# Patient Record
Sex: Male | Born: 1975 | Race: White | Hispanic: No | Smoking: Former smoker
Health system: Southern US, Community
[De-identification: ages and names within clinical notes are randomized; demographics above are authoritative.]

## PROBLEM LIST (undated history)

## (undated) DIAGNOSIS — K219 Gastro-esophageal reflux disease without esophagitis: Secondary | ICD-10-CM

## (undated) DIAGNOSIS — Z972 Presence of dental prosthetic device (complete) (partial): Secondary | ICD-10-CM

## (undated) HISTORY — PX: APPENDECTOMY: SHX54

---

## 2004-06-23 ENCOUNTER — Emergency Department: Payer: Self-pay | Admitting: Emergency Medicine

## 2007-01-08 ENCOUNTER — Emergency Department: Payer: Self-pay | Admitting: Emergency Medicine

## 2009-08-22 ENCOUNTER — Emergency Department: Payer: Self-pay | Admitting: Emergency Medicine

## 2010-12-24 ENCOUNTER — Emergency Department: Payer: Self-pay | Admitting: Emergency Medicine

## 2011-06-16 ENCOUNTER — Emergency Department: Payer: Self-pay

## 2012-03-17 ENCOUNTER — Emergency Department: Payer: Self-pay

## 2012-03-17 LAB — RAPID INFLUENZA A&B ANTIGENS

## 2012-12-15 ENCOUNTER — Emergency Department: Payer: Self-pay | Admitting: Emergency Medicine

## 2013-06-24 ENCOUNTER — Emergency Department: Payer: Self-pay | Admitting: Emergency Medicine

## 2017-09-03 ENCOUNTER — Emergency Department: Payer: Commercial Managed Care - PPO

## 2017-09-03 ENCOUNTER — Other Ambulatory Visit: Payer: Self-pay

## 2017-09-03 ENCOUNTER — Emergency Department
Admission: EM | Admit: 2017-09-03 | Discharge: 2017-09-03 | Disposition: A | Discharge status: Home / Self Care | Payer: Commercial Managed Care - PPO | Attending: Emergency Medicine | Admitting: Emergency Medicine

## 2017-09-03 ENCOUNTER — Encounter: Payer: Self-pay | Admitting: Emergency Medicine

## 2017-09-03 DIAGNOSIS — M79609 Pain in unspecified limb: Secondary | ICD-10-CM

## 2017-09-03 DIAGNOSIS — H538 Other visual disturbances: Secondary | ICD-10-CM

## 2017-09-03 DIAGNOSIS — G459 Transient cerebral ischemic attack, unspecified: Secondary | ICD-10-CM | POA: Insufficient documentation

## 2017-09-03 DIAGNOSIS — R202 Paresthesia of skin: Secondary | ICD-10-CM | POA: Insufficient documentation

## 2017-09-03 LAB — DIFFERENTIAL
BASOS PCT: 1 %
Basophils Absolute: 0.1 10*3/uL (ref 0–0.1)
EOS PCT: 4 %
Eosinophils Absolute: 0.3 10*3/uL (ref 0–0.7)
Lymphocytes Relative: 31 %
Lymphs Abs: 2.5 10*3/uL (ref 1.0–3.6)
MONOS PCT: 6 %
Monocytes Absolute: 0.5 10*3/uL (ref 0.2–1.0)
NEUTROS PCT: 58 %
Neutro Abs: 4.7 10*3/uL (ref 1.4–6.5)

## 2017-09-03 LAB — COMPREHENSIVE METABOLIC PANEL
ALT: 45 U/L (ref 17–63)
ANION GAP: 7 (ref 5–15)
AST: 33 U/L (ref 15–41)
Albumin: 4.3 g/dL (ref 3.5–5.0)
Alkaline Phosphatase: 116 U/L (ref 38–126)
BILIRUBIN TOTAL: 0.8 mg/dL (ref 0.3–1.2)
BUN: 10 mg/dL (ref 6–20)
CHLORIDE: 107 mmol/L (ref 101–111)
CO2: 26 mmol/L (ref 22–32)
Calcium: 9.1 mg/dL (ref 8.9–10.3)
Creatinine, Ser: 0.66 mg/dL (ref 0.61–1.24)
GFR calc Af Amer: >60 mL/min (ref >60)
Glucose, Bld: 110 mg/dL — ABNORMAL HIGH (ref 65–99)
POTASSIUM: 3.9 mmol/L (ref 3.5–5.1)
Sodium: 140 mmol/L (ref 135–145)
TOTAL PROTEIN: 7.9 g/dL (ref 6.5–8.1)

## 2017-09-03 LAB — GLUCOSE, CAPILLARY: Glucose-Capillary: 107 mg/dL — ABNORMAL HIGH (ref 65–99)

## 2017-09-03 LAB — TROPONIN I

## 2017-09-03 LAB — APTT: APTT: 35 s (ref 24–36)

## 2017-09-03 LAB — CBC
HEMATOCRIT: 44.2 % (ref 40.0–52.0)
HEMOGLOBIN: 15.1 g/dL (ref 13.0–18.0)
MCH: 33.1 pg (ref 26.0–34.0)
MCHC: 34.1 g/dL (ref 32.0–36.0)
MCV: 96.9 fL (ref 80.0–100.0)
Platelets: 240 10*3/uL (ref 150–440)
RBC: 4.56 MIL/uL (ref 4.40–5.90)
RDW: 13.8 % (ref 11.5–14.5)
WBC: 8.1 10*3/uL (ref 3.8–10.6)

## 2017-09-03 LAB — PROTIME-INR
INR: 0.85
Prothrombin Time: 11.5 seconds (ref 11.4–15.2)

## 2017-09-03 MED ORDER — ASPIRIN EC 325 MG PO TBEC: 325.0000 mg | DELAYED_RELEASE_TABLET | Freq: Every day | ORAL | 0 refills | Status: DC

## 2017-09-03 MED ORDER — IOPAMIDOL (ISOVUE-370) INJECTION 76%
75.0000 mL | Freq: Once | INTRAVENOUS | Status: AC | PRN
  Administered 2017-09-03: 75 mL via INTRAVENOUS

## 2017-09-03 MED ORDER — ASPIRIN 81 MG PO CHEW
324.0000 mg | CHEWABLE_TABLET | Freq: Once | ORAL | Status: AC
  Administered 2017-09-03: 324 mg via ORAL
  Filled 2017-09-03: qty 4

## 2017-09-03 NOTE — Discharge Instructions (Signed)
Your CT scan of the brain and CT angiogram of the head and neck were normal today.  Please follow up with your doctor for further evaluation of stroke risk factors. Continue taking aspirin every day until then.

## 2017-09-03 NOTE — ED Provider Notes (Signed)
[facility] Emergency Department Provider Note  ____________________________________________  Time seen: Approximately 7:57 PM  I have reviewed the triage vital signs and the nursing notes.   HISTORY  Chief Complaint Code Stroke    HPI Manuel Jones is a 42 y.o. male who comes to the ED due to sudden onset of bilateral blurry vision and left upper arm tingling and numbness that started at 1:45 PM today.never had anything like this before. No recent trauma. No blood thinner use. No history of strokes. He complains of mild occipital headache as well. Symptoms are constant, improving, mild to moderate severity at present. No aggravating or alleviating factors.    History reviewed. No pertinent past medical history.   There are no active problems to display for this patient.    Past surgical history noncontributory   Prior to Admission medications   Medication Sig Start Date End Date Taking? Authorizing Provider  aspirin EC 325 MG tablet Take 1 tablet (325 mg total) by mouth daily. 09/03/17   Carrie Mew, MD     Allergies Patient has no known allergies.   History reviewed. No pertinent family history.  Social History Social History   Tobacco Use  . Smoking status: Not on file  Substance Use Topics  . Alcohol use: Not on file  . Drug use: Not on file    Review of Systems  Constitutional:   No fever or chills.  ENT:   No sore throat. No rhinorrhea. Cardiovascular:   No chest pain or syncope. Respiratory:   No dyspnea or cough. Gastrointestinal:   Negative for abdominal pain, vomiting and diarrhea.  Musculoskeletal:   Negative for focal pain or swelling All other systems reviewed and are negative except as documented above in ROS and HPI.  ____________________________________________   PHYSICAL EXAM:  VITAL SIGNS: ED Triage Vitals  Enc Vitals Group     BP 09/03/17 1542 131/90     Pulse Rate 09/03/17 1542 83     Resp 09/03/17  1556 17     Temp 09/03/17 1542 98.2 F (36.8 C)     Temp Source 09/03/17 1542 Oral     SpO2 09/03/17 1542 98 %     Weight 09/03/17 1644 225 lb (102.1 kg)     Height 09/03/17 1644 6' (1.829 m)     Head Circumference --      Peak Flow --      Pain Score 09/03/17 1542 0     Pain Loc --      Pain Edu? --      Excl. in [place]? --     Vital signs reviewed, nursing assessments reviewed.   Constitutional:   Alert and oriented. Well appearing and in no distress. Eyes:   Conjunctivae are normal. EOMI. PERRL.no nystagmus. No skew ENT      Head:   Normocephalic and atraumatic.      Nose:   No congestion/rhinnorhea.       Mouth/Throat:   MMM, no pharyngeal erythema. No peritonsillar mass.       Neck:   No meningismus. Full ROM. Hematological/Lymphatic/Immunilogical:   No cervical lymphadenopathy. Cardiovascular:   RRR. Symmetric bilateral radial and DP pulses.  No murmurs.  Respiratory:   Normal respiratory effort without tachypnea/retractions. Breath sounds are clear and equal bilaterally. No wheezes/rales/rhonchi. Gastrointestinal:   Soft and nontender. Non distended. There is no CVA tenderness.  No rebound, rigidity, or guarding.  Musculoskeletal:   Normal range of motion in all extremities. No joint  effusions.  No lower extremity tenderness.  No edema. Neurologic:   Normal speech and language.  Motor grossly intact. cerebellar function normal NIH stroke scale 1 for left upper extremity paresthesia No acute focal neurologic deficits are appreciated.  Skin:    Skin is warm, dry and intact. No rash noted.  No petechiae, purpura, or bullae.  ____________________________________________    LABS (pertinent positives/negatives) (all labs ordered are listed, but only abnormal results are displayed) Labs Reviewed  GLUCOSE, CAPILLARY - Abnormal; Notable for the following components:      Result Value   Glucose-Capillary 107 (*)    All other components within normal limits  COMPREHENSIVE  METABOLIC PANEL - Abnormal; Notable for the following components:   Glucose, Bld 110 (*)    All other components within normal limits  PROTIME-INR  APTT  CBC  DIFFERENTIAL  TROPONIN I  CBG MONITORING, ED   ____________________________________________   EKG  interpreted by me Sinus rhythm rate of 79, normal axis and intervals. Normal QRS ST segments and T waves.  ____________________________________________    RADIOLOGY  Ct Angio Head W Or Wo Contrast  Result Date: 09/03/2017 CLINICAL DATA:  Initial evaluation for acute left eye blurriness, presyncopal symptoms. EXAM: CT ANGIOGRAPHY HEAD AND NECK TECHNIQUE: Multidetector CT imaging of the head and neck was performed using the standard protocol during bolus administration of intravenous contrast. Multiplanar CT image reconstructions and MIPs were obtained to evaluate the vascular anatomy. Carotid stenosis measurements (when applicable) are obtained utilizing NASCET criteria, using the distal internal carotid diameter as the denominator. CONTRAST:  69mL ISOVUE-370 IOPAMIDOL (ISOVUE-370) INJECTION 76% COMPARISON:  Prior noncontrast head CT from earlier the same day. FINDINGS: CTA NECK FINDINGS Aortic arch: Visualized aortic arch of normal caliber with normal 3 vessel morphology. No flow-limiting stenosis about the origin of the great vessels. Visualized subclavian arteries widely patent. Right carotid system: Right common and internal carotid arteries widely patent without stenosis, dissection, or occlusion. No significant atheromatous narrowing about the right carotid bifurcation. Left carotid system: Left common and internal carotid arteries widely patent without stenosis, dissection, or occlusion. No significant atheromatous narrowing about the left carotid bifurcation. Vertebral arteries: Both of the vertebral arteries arise from the subclavian arteries. Vertebral arteries widely patent within the neck without stenosis, dissection or  occlusion. Skeleton: No acute osseus abnormality. No worrisome lytic or blastic osseous lesions. Other neck: No acute soft tissue abnormality within the neck. No adenopathy. Salivary glands demonstrate no acute finding. Thyroid normal. Upper chest: Visualized upper chest and mediastinum within normal limits. Partially visualized lungs are grossly clear. Centrilobular and paraseptal emphysema noted. Review of the MIP images confirms the above findings CTA HEAD FINDINGS Anterior circulation: Internal carotid arteries widely patent to the termini without stenosis. ICA termini themselves are widely patent. A1 segments, anterior communicating artery common anterior cerebral arteries widely patent bilaterally. No M1 stenosis or occlusion. Normal MCA bifurcations. No proximal M2 occlusion. Distal MCA branches well opacified and symmetric. Posterior circulation: Vertebral arteries widely patent to the vertebrobasilar junction without stenosis. Posterior inferior cerebral arteries patent bilaterally. Basilar artery widely patent to its distal aspect without stenosis. Superior cerebellar and posterior cerebral arteries widely patent bilaterally. Venous sinuses: Patent. Anatomic variants: None significant.  No aneurysm. Delayed phase: No abnormal enhancement. Review of the MIP images confirms the above findings IMPRESSION: 1. Normal CTA of the head and neck. No large vessel occlusion. No hemodynamically significant or correctable stenosis. 2. Emphysema. Electronically Signed   By: Pincus Badder.D.  On: 09/03/2017 18:00   Ct Angio Neck W And/or Wo Contrast  Result Date: 09/03/2017 CLINICAL DATA:  Initial evaluation for acute left eye blurriness, presyncopal symptoms. EXAM: CT ANGIOGRAPHY HEAD AND NECK TECHNIQUE: Multidetector CT imaging of the head and neck was performed using the standard protocol during bolus administration of intravenous contrast. Multiplanar CT image reconstructions and MIPs were obtained to  evaluate the vascular anatomy. Carotid stenosis measurements (when applicable) are obtained utilizing NASCET criteria, using the distal internal carotid diameter as the denominator. CONTRAST:  54mL ISOVUE-370 IOPAMIDOL (ISOVUE-370) INJECTION 76% COMPARISON:  Prior noncontrast head CT from earlier the same day. FINDINGS: CTA NECK FINDINGS Aortic arch: Visualized aortic arch of normal caliber with normal 3 vessel morphology. No flow-limiting stenosis about the origin of the great vessels. Visualized subclavian arteries widely patent. Right carotid system: Right common and internal carotid arteries widely patent without stenosis, dissection, or occlusion. No significant atheromatous narrowing about the right carotid bifurcation. Left carotid system: Left common and internal carotid arteries widely patent without stenosis, dissection, or occlusion. No significant atheromatous narrowing about the left carotid bifurcation. Vertebral arteries: Both of the vertebral arteries arise from the subclavian arteries. Vertebral arteries widely patent within the neck without stenosis, dissection or occlusion. Skeleton: No acute osseus abnormality. No worrisome lytic or blastic osseous lesions. Other neck: No acute soft tissue abnormality within the neck. No adenopathy. Salivary glands demonstrate no acute finding. Thyroid normal. Upper chest: Visualized upper chest and mediastinum within normal limits. Partially visualized lungs are grossly clear. Centrilobular and paraseptal emphysema noted. Review of the MIP images confirms the above findings CTA HEAD FINDINGS Anterior circulation: Internal carotid arteries widely patent to the termini without stenosis. ICA termini themselves are widely patent. A1 segments, anterior communicating artery common anterior cerebral arteries widely patent bilaterally. No M1 stenosis or occlusion. Normal MCA bifurcations. No proximal M2 occlusion. Distal MCA branches well opacified and symmetric.  Posterior circulation: Vertebral arteries widely patent to the vertebrobasilar junction without stenosis. Posterior inferior cerebral arteries patent bilaterally. Basilar artery widely patent to its distal aspect without stenosis. Superior cerebellar and posterior cerebral arteries widely patent bilaterally. Venous sinuses: Patent. Anatomic variants: None significant.  No aneurysm. Delayed phase: No abnormal enhancement. Review of the MIP images confirms the above findings IMPRESSION: 1. Normal CTA of the head and neck. No large vessel occlusion. No hemodynamically significant or correctable stenosis. 2. Emphysema. Electronically Signed   By: Jeannine Boga M.D.   On: 09/03/2017 18:00   Ct Head Code Stroke Wo Contrast  Result Date: 09/03/2017 CLINICAL DATA:  Code stroke. Left-sided blurred vision and left-sided weakness beginning 1 hour ago. EXAM: CT HEAD WITHOUT CONTRAST TECHNIQUE: Contiguous axial images were obtained from the base of the skull through the vertex without intravenous contrast. COMPARISON:  None FINDINGS: Brain: No acute infarct, hemorrhage, or mass lesion is present. The ventricles are of normal size. White matter is within normal limits. Basal ganglia are intact. Insular cortex is normal. The brainstem and cerebellum are within normal limits. Vascular: Calcification. Skull: Calvarium is intact. No focal lytic or blastic lesions are present. No significant extracranial soft tissue lesions are present. Sinuses/Orbits: The paranasal sinuses and the mastoid air cells are clear. Globes and orbits are within normal limits. ASPECTS [facility] Stroke Program Early CT Score) - Ganglionic level infarction (caudate, lentiform nuclei, internal capsule, insula, M1-M3 cortex): 7/7 - Supraganglionic infarction (M4-M6 cortex): 3/3 Total score (0-10 with 10 being normal): 10/10 IMPRESSION: 1. Negative CT of the head. 2. ASPECTS is  10/10 These results were called by telephone at the time of interpretation  on 09/03/2017 at 3:56 pm to Dr. Carrie Mew , who verbally acknowledged these results. Electronically Signed   By: San Morelle M.D.   On: 09/03/2017 16:02    ____________________________________________   PROCEDURES .Critical Care Performed by: Carrie Mew, MD Authorized by: Carrie Mew, MD   Critical care provider statement:    Critical care time (minutes):  30   Critical care time was exclusive of:  Separately billable procedures and treating other patients   Critical care was necessary to treat or prevent imminent or life-threatening deterioration of the following conditions:  CNS failure or compromise   Critical care was time spent personally by me on the following activities:  Development of treatment plan with patient or surrogate, discussions with consultants, evaluation of patient's response to treatment, examination of patient, obtaining history from patient or surrogate, ordering and performing treatments and interventions, ordering and review of laboratory studies, ordering and review of radiographic studies, pulse oximetry, re-evaluation of patient's condition and review of old charts    ____________________________________________  DIFFERENTIAL DIAGNOSIS   acute ischemic stroke, intracranial hemorrhage, TIA, atypical headache syndrome. Low suspicion for temporal arteritis, intracranial hypertension, meningitis.  CLINICAL IMPRESSION / ASSESSMENT AND PLAN / ED COURSE  Pertinent labs & imaging results that were available during my care of the patient were reviewed by me and considered in my medical decision making (see chart for details).      Clinical Course as of Sep 04 1955  Thu Sep 03, 2017  1558 LKW 1:45 pm today, subjective neuro complaints of left eye blurry, LUE tingling. Will obtain neuro consult.    [PS]  1600 CT head negative per radiology call.    [PS]  2694 D/w neurology after their eval. Offered TPA, pt declines. Recommends CTA  head/neck. Aspirin. Observation for stroke workup   [PS]  1809 CTA head/neck normal. Will d/w hospitalist for further eval.    [PS]  1953 CT angiogram results discussed with patient. Discussed neurology recommendation for hospitalization for further stroke workup. Patient declines and would rather follow up with his primary care doctor for outpatient workup. He is agreeable to continuing daily aspirin in the meantime. Return precautions discussed, he'll come back to the hospital if he has any worsening stroke symptoms or new concerns.he does report that his symptoms are resolved at this time. He feels back to normal.   [PS]    Clinical Course User Index [PS] Carrie Mew, MD    ----------------------------------------- 8:01 PM on 09/03/2017 -----------------------------------------  Patient initially required complex and involved care in the emergency department pending initial workup due to concerns for acute stroke within the TPA or neuro interventional window that would require time sensitive interventions.  forcibly workup has been unremarkable so far. Patient was offered hospitalization but declines for his own preference. He does have medical decision-making capacity and will be discharged home.   ____________________________________________   FINAL CLINICAL IMPRESSION(S) / ED DIAGNOSES    Final diagnoses:  TIA (transient ischemic attack)  Blurry vision, bilateral  Paresthesia and pain of left extremity     ED Discharge Orders        Ordered    aspirin EC 325 MG tablet  Daily     09/03/17 1956      Portions of this note were generated with dragon dictation software. Dictation errors may occur despite best attempts at proofreading.    Carrie Mew, MD 09/03/17 2002

## 2017-09-03 NOTE — ED Triage Notes (Signed)
CBG 107 

## 2017-09-03 NOTE — Progress Notes (Signed)
Chaplain was paged for a code stroke. Pt was alert and speaking. No family was present. He said his mother will come after work. Chaplain prayed silently for Pt and care team. Chaplain asked if anytime the Pt needed, he said no. Chaplain let him know if he needed services to let the nurse know.    09/03/17 1600  Clinical Encounter Type  Visited With Patient  Visit Type Initial;Code  Referral From Nurse  Spiritual Encounters  Spiritual Needs Prayer

## 2017-09-03 NOTE — ED Notes (Signed)
CODE STROKE CALLED TO 333 

## 2017-09-03 NOTE — Consult Note (Signed)
   [facility] TeleNeurology Consult Services  Impression:  Stroke   Not a tpa candidate due PO:EUMPNTIR resolved. no residual disabling symptoms. TPA was offered but patient did not want to proceed Symptoms  not consistent with LVO therefore no NIR  Comments:   Last Known Well: 13:45 [facility] contacted: 15:57 [facility] at bedside: 15:59 NIHSS assessment time: 16:02  Recommendations:  Admit for stroke workup. ASA/Statin if no contraindications. IV Fluids Inpatient neurology consultation Inpatient stroke evaluation as per Neurology/ Internal Medicine Discussed with ED MD Please call with questions  -----------------------------------------------------------------------------------------  CC: blurry vision and left sided numbness  History of Present Illness:  Patient is a 42 YO M with no significant PMH presented with left eye blurry vision, feeling of nearly passing out and left sided numbness that started at 13:45. States that symptoms are improved. Denies any speech or language deficits. Denies any Loss of vision. No facial droop   Diagnostic: CT Head: No acute Intracranial findings.  Exam: NIH Stroke Scale/Score (NIHSS)   RESULT SUMMARY: 1 points NIH Stroke Scale   INPUTS: 1A: Level of consciousness -> 0 = Alert; keenly responsive 1B: Ask month and age -> 0 = Both questions right 1C: 'Blink eyes' & 'squeeze hands' -> 0 = Performs both tasks 2: Horizontal extraocular movements -> 0 = Normal 3: Visual fields -> 0 = No visual loss 4: Facial palsy -> 0 = Normal symmetry 5A: Left arm motor drift -> 0 = No drift for 10 seconds 5B: Right arm motor drift -> 0 = No drift for 10 seconds 6A: Left leg motor drift -> 0 = No drift for 5 seconds 6B: Right leg motor drift -> 0 = No drift for 5 seconds 7: Limb Ataxia -> 0 = No ataxia 8: Sensation -> 1 = Mild-moderate loss: less sharp/more dull  9: Language/aphasia -> 0 = Normal; no aphasia 10: Dysarthria -> 0 =  Normal 11: Extinction/inattention -> 0 = No abnormality  Medical Decision Making:  - Extensive number of diagnosis or management options are considered above.   - Extensive amount of complex data reviewed.   - High risk of complication and/or morbidity or mortality are associated with differential diagnostic considerations above.  - There may be Uncertain outcome and increased probability of prolonged functional impairment or high probability of severe prolonged functional impairment associated with some of these differential diagnosis.  Medical Data Reviewed:  1.Data reviewed include clinical labs, radiology,  Medical Tests;   2.Tests results discussed w/performing or interpreting physician;   3.Obtaining/reviewing old medical records;  4.Obtaining case history from another source;  5.Independent review of image, tracing or specimen.    Patient was informed the neurology consult would happen via telehealth consult by way of interactive audio and video telecommunications and consented to receiving care in this manner.

## 2017-09-03 NOTE — ED Triage Notes (Signed)
Pt reports that an hour ago he developed left eye blurriness and feeling like he was going to pass out. Pt states that he feels that he is weaker on the left side.

## 2017-09-03 NOTE — Code Documentation (Signed)
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

## 2018-03-30 ENCOUNTER — Other Ambulatory Visit: Payer: Self-pay

## 2018-03-30 ENCOUNTER — Emergency Department: Payer: Commercial Managed Care - PPO

## 2018-03-30 ENCOUNTER — Encounter: Payer: Self-pay | Admitting: Emergency Medicine

## 2018-03-30 ENCOUNTER — Emergency Department
Admission: EM | Admit: 2018-03-30 | Discharge: 2018-03-30 | Disposition: A | Discharge status: Home / Self Care | Payer: Commercial Managed Care - PPO | Attending: Emergency Medicine | Admitting: Emergency Medicine

## 2018-03-30 DIAGNOSIS — Z7982 Long term (current) use of aspirin: Secondary | ICD-10-CM | POA: Insufficient documentation

## 2018-03-30 DIAGNOSIS — F17210 Nicotine dependence, cigarettes, uncomplicated: Secondary | ICD-10-CM | POA: Insufficient documentation

## 2018-03-30 DIAGNOSIS — R0789 Other chest pain: Secondary | ICD-10-CM | POA: Diagnosis not present

## 2018-03-30 DIAGNOSIS — M79622 Pain in left upper arm: Secondary | ICD-10-CM | POA: Diagnosis not present

## 2018-03-30 DIAGNOSIS — R079 Chest pain, unspecified: Secondary | ICD-10-CM | POA: Diagnosis present

## 2018-03-30 DIAGNOSIS — K2100 Gastro-esophageal reflux disease with esophagitis, without bleeding: Secondary | ICD-10-CM

## 2018-03-30 LAB — BASIC METABOLIC PANEL
Anion gap: 6 (ref 5–15)
BUN: 9 mg/dL (ref 6–20)
CO2: 26 mmol/L (ref 22–32)
Calcium: 8.8 mg/dL — ABNORMAL LOW (ref 8.9–10.3)
Chloride: 112 mmol/L — ABNORMAL HIGH (ref 98–111)
Creatinine, Ser: 0.82 mg/dL (ref 0.61–1.24)
GFR calc Af Amer: >60 mL/min (ref >60)
GFR calc non Af Amer: >60 mL/min (ref >60)
GLUCOSE: 145 mg/dL — AB (ref 70–99)
Potassium: 3.2 mmol/L — ABNORMAL LOW (ref 3.5–5.1)
Sodium: 144 mmol/L (ref 135–145)

## 2018-03-30 LAB — CBC
HEMATOCRIT: 41.9 % (ref 39.0–52.0)
Hemoglobin: 14.2 g/dL (ref 13.0–17.0)
MCH: 32.5 pg (ref 26.0–34.0)
MCHC: 33.9 g/dL (ref 30.0–36.0)
MCV: 95.9 fL (ref 80.0–100.0)
Platelets: 243 10*3/uL (ref 150–400)
RBC: 4.37 MIL/uL (ref 4.22–5.81)
RDW: 12.6 % (ref 11.5–15.5)
WBC: 8.7 10*3/uL (ref 4.0–10.5)
nRBC: 0 % (ref 0.0–0.2)

## 2018-03-30 LAB — TROPONIN I: Troponin I: <0.03 ng/mL (ref <0.03)

## 2018-03-30 LAB — FIBRIN DERIVATIVES D-DIMER (ARMC ONLY): Fibrin derivatives D-dimer (ARMC): 484.77 ng/mL (FEU) (ref 0.00–499.00)

## 2018-03-30 MED ORDER — FAMOTIDINE 40 MG PO TABS: 40.0000 mg | ORAL_TABLET | Freq: Every evening | ORAL | 1 refills | Status: DC

## 2018-03-30 MED ORDER — ALUM & MAG HYDROXIDE-SIMETH 200-200-20 MG/5ML PO SUSP
30.0000 mL | Freq: Once | ORAL | Status: AC
  Administered 2018-03-30: 30 mL via ORAL
  Filled 2018-03-30: qty 30

## 2018-03-30 MED ORDER — SUCRALFATE 1 G PO TABS: 1.0000 g | ORAL_TABLET | Freq: Four times a day (QID) | ORAL | 0 refills | Status: DC

## 2018-03-30 MED ORDER — LIDOCAINE VISCOUS HCL 2 % MT SOLN
15.0000 mL | Freq: Once | OROMUCOSAL | Status: AC
  Administered 2018-03-30: 15 mL via ORAL
  Filled 2018-03-30: qty 15

## 2018-03-30 NOTE — ED Triage Notes (Signed)
Pt presents to ED with c/o sharp/burning chest pain to the L side x 3 days with radiation to neck and L arm. Pt states pain is intermittent at this time.

## 2018-03-30 NOTE — ED Triage Notes (Signed)
Pt care discussed with Dr. Joni Fears, per Dr. Joni Fears, add on a D-dimer.

## 2018-03-30 NOTE — ED Provider Notes (Signed)
[facility] Emergency Department Provider Note   ____________________________________________   I have reviewed the triage vital signs and the nursing notes.   HISTORY  Chief Complaint Chest Pain   History limited by: Not Limited   HPI Manuel Jones is a 42 y.o. male who presents to the emergency department today because of concern for chest pain. The patient states that the pain started three days ago. The pain is located in the center chest and left chest.  There is some radiation to the left upper arm.  The patient states the pain did seem to get somewhat better when he however would then get worse again.  He did not notice any worse needing of the pain with exertion.  He denies any shortness of breath or nausea.  He denies any fevers.   Per medical record review patient has a history of appendectomy  History reviewed. No pertinent past medical history.  There are no active problems to display for this patient.   Past Surgical History:  Procedure Laterality Date  . APPENDECTOMY      Prior to Admission medications   Medication Sig Start Date End Date Taking? Authorizing Provider  aspirin EC 325 MG tablet Take 1 tablet (325 mg total) by mouth daily. 09/03/17   Carrie Mew, MD    Allergies Patient has no known allergies.  No family history on file.  Social History Social History   Tobacco Use  . Smoking status: Current Every Day Smoker    Packs/day: 0.50    Types: Cigarettes  . Smokeless tobacco: Never Used  Substance Use Topics  . Alcohol use: Not Currently  . Drug use: Not Currently    Review of Systems Constitutional: No fever/chills Eyes: No visual changes. ENT: No sore throat. Cardiovascular: Positive for chest pain Respiratory: Denies shortness of breath. Gastrointestinal: No abdominal pain.  No nausea, no vomiting.  No diarrhea.  Genitourinary: Negative for dysuria. Musculoskeletal: Negative for back pain. Skin:  Negative for rash. Neurological: Negative for headaches, focal weakness or numbness.  ____________________________________________   PHYSICAL EXAM:  VITAL SIGNS: ED Triage Vitals [03/30/18 1839]  Enc Vitals Group     BP 131/82     Pulse Rate (!) 101     Resp 18     Temp 98.1 F (36.7 C)     Temp Source Oral     SpO2 98 %     Weight 230 lb (104.3 kg)     Height 6' (1.829 m)     Head Circumference      Peak Flow      Pain Score 4   Constitutional: Alert and oriented.  Eyes: Conjunctivae are normal.  ENT      Head: Normocephalic and atraumatic.      Nose: No congestion/rhinnorhea.      Mouth/Throat: Mucous membranes are moist.      Neck: No stridor. Hematological/Lymphatic/Immunilogical: No cervical lymphadenopathy. Cardiovascular: Normal rate, regular rhythm.  No murmurs, rubs, or gallops.  Respiratory: Normal respiratory effort without tachypnea nor retractions. Breath sounds are clear and equal bilaterally. No wheezes/rales/rhonchi. Gastrointestinal: Soft and non tender. No rebound. No guarding.  Genitourinary: Deferred Musculoskeletal: Normal range of motion in all extremities. No lower extremity edema. Neurologic:  Normal speech and language. No gross focal neurologic deficits are appreciated.  Skin:  Skin is warm, dry and intact. No rash noted. Psychiatric: Mood and affect are normal. Speech and behavior are normal. Patient exhibits appropriate insight and judgment.  ____________________________________________  LABS (pertinent positives/negatives) Trop <0.03 D-dimer 484 CBC wbc 8.7, hgb 14.2, plt 243 BMP na 144, k 3.2, glu 145, cr 0.82  ____________________________________________   EKG  I, Nance Pear, attending physician, personally viewed and interpreted this EKG  EKG Time: 1830 Rate: 99 Rhythm: normal sinus rhythm Axis: normal Intervals: qtc 444 QRS: narrow, q waves II, III, avf ST changes: no st elevation Impression: abnormal  ekg   ____________________________________________    RADIOLOGY  CXR No acute disease  ____________________________________________   PROCEDURES  Procedures  ____________________________________________   INITIAL IMPRESSION / ASSESSMENT AND PLAN / ED COURSE  Pertinent labs & imaging results that were available during my care of the patient were reviewed by me and considered in my medical decision making (see chart for details).   Patient presented to the emergency department today because of concerns for chest pain.  Differential would be broad including ACS, pneumonia, pneumothorax, GERD, costochondritis amongst other etiologies.  Patient's blood work and EKG without acute findings.  Patient chest x-ray without concerning findings.  He did feel better after GI cocktail.  At this point I think are likely.  Discussed this with the patient.  Will discharge with antiacid and sucralfate.   ____________________________________________   FINAL CLINICAL IMPRESSION(S) / ED DIAGNOSES  Final diagnoses:  Atypical chest pain  Gastroesophageal reflux disease with esophagitis     Note: This dictation was prepared with Dragon dictation. Any transcriptional errors that result from this process are unintentional     Nance Pear, MD 03/30/18 2240

## 2018-03-30 NOTE — Discharge Instructions (Addendum)
Please seek medical attention for any high fevers, chest pain, shortness of breath, change in behavior, persistent vomiting, bloody stool or any other new or concerning symptoms.  

## 2018-04-29 ENCOUNTER — Other Ambulatory Visit: Payer: Self-pay | Admitting: Internal Medicine

## 2018-04-29 ENCOUNTER — Other Ambulatory Visit (HOSPITAL_COMMUNITY): Payer: Self-pay | Admitting: Internal Medicine

## 2018-04-29 DIAGNOSIS — R1011 Right upper quadrant pain: Secondary | ICD-10-CM

## 2018-05-06 ENCOUNTER — Ambulatory Visit
Admission: RE | Admit: 2018-05-06 | Discharge: 2018-05-06 | Disposition: A | Discharge status: Home / Self Care | Payer: Commercial Managed Care - PPO | Source: Ambulatory Visit | Attending: Internal Medicine | Admitting: Internal Medicine

## 2018-05-06 DIAGNOSIS — R1011 Right upper quadrant pain: Secondary | ICD-10-CM | POA: Diagnosis present

## 2018-06-01 ENCOUNTER — Encounter: Payer: Self-pay | Admitting: Internal Medicine

## 2018-06-02 ENCOUNTER — Ambulatory Visit: Payer: Commercial Managed Care - PPO | Admitting: Gastroenterology

## 2018-06-02 ENCOUNTER — Other Ambulatory Visit: Payer: Self-pay

## 2018-06-02 ENCOUNTER — Encounter: Payer: Self-pay | Admitting: Gastroenterology

## 2018-06-02 ENCOUNTER — Encounter: Payer: Self-pay | Admitting: *Deleted

## 2018-06-02 VITALS — BP 143/85 | HR 60 | Resp 17 | Ht 72.0 in | Wt 228.4 lb

## 2018-06-02 DIAGNOSIS — R1013 Epigastric pain: Secondary | ICD-10-CM

## 2018-06-02 DIAGNOSIS — Z800 Family history of malignant neoplasm of digestive organs: Secondary | ICD-10-CM | POA: Diagnosis not present

## 2018-06-02 NOTE — Progress Notes (Signed)
Cephas Darby, MD [address]  [place], [place] [address]  Main: [phone]  Fax: [phone]    Gastroenterology Consultation  Referring Provider:     Durward Parcel, MD Primary Care Physician:  Durward Parcel, MD Primary Gastroenterologist:  Dr. Cephas Darby Reason for Consultation:     Epigastric/right upper quadrant pain        HPI:   Manuel Jones is a 43 y.o. Caucasian male referred by Dr. Durward Parcel, MD  for consultation & management of 3 months history of epigastric/right upper quadrant pain, mostly burning, and sometimes sharp, postprandial, worse after eating, sometimes radiating to right lower back.  He underwent ultrasound abdomen which was unremarkable, LFTs and CBC normal.  He is started on omeprazole 20 mg once a day.  Patient went to ER in early December secondary to chest pain radiating to left arm and EKG was negative, troponins normal.  He was started on H2 blocker and sucralfate.  Currently, he is only taking PPI.  He started taking aspirin 325 mg daily since ER visit on his own.  He also reports lower abdominal discomfort, soft brown stools.  He denies any other GI symptoms.  He quit smoking about 3 months ago after his ER visit.  He used to be heavy smoker.  He is a widower, wife died 8 years ago, lives with his mom.  He does machine work.  He denies history of depression, anxiety or stress.  NSAIDs: Aspirin 325 mg daily  Antiplts/Anticoagulants/Anti thrombotics: None  GI Procedures: None His father passed away from colon cancer in his 41s  History reviewed. No pertinent past medical history.  Past Surgical History:  Procedure Laterality Date  . APPENDECTOMY      Current Outpatient Medications:  .  aspirin EC 325 MG tablet, Take 1 tablet (325 mg total) by mouth daily., Disp: 30 tablet, Rfl: 0 .  predniSONE (DELTASONE) 20 MG tablet, TAKE 2 TABLET BY MOUTH IMMEDIATELY. REPEAT SAME DOSE ONCE IN 24 HOURS., Disp: , Rfl:    History  reviewed. No pertinent family history.   Social History   Tobacco Use  . Smoking status: Current Every Day Smoker    Packs/day: 0.50    Types: Cigarettes  . Smokeless tobacco: Never Used  Substance Use Topics  . Alcohol use: Not Currently  . Drug use: Not Currently    Allergies as of 06/02/2018  . (No Known Allergies)    Review of Systems:    All systems reviewed and negative except where noted in HPI.   Physical Exam:  BP (!) 143/85 (BP Location: Left Arm, Patient Position: Sitting, Cuff Size: Large)   Pulse 60   Resp 17   Ht 6' (1.829 m)   Wt 228 lb 6.4 oz (103.6 kg)   SpO2 98%   BMI 30.98 kg/m  No LMP for male patient.  General:   Alert,  Well-developed, well-nourished, pleasant and cooperative in NAD Head:  Normocephalic and atraumatic. Eyes:  Sclera clear, no icterus.   Conjunctiva pink. Ears:  Normal auditory acuity. Nose:  No deformity, discharge, or lesions. Mouth:  No deformity or lesions,oropharynx pink & moist. Neck:  Supple; no masses or thyromegaly. Lungs:  Respirations even and unlabored.  Clear throughout to auscultation.   No wheezes, crackles, or rhonchi. No acute distress. Heart:  Regular rate and rhythm; no murmurs, clicks, rubs, or gallops. Abdomen:  Normal bowel sounds. Soft, mild epigastric and right upper quadrant tenderness and non-distended without  masses, hepatosplenomegaly or hernias noted.  No guarding or rebound tenderness.   Rectal: Not performed Msk:  Symmetrical without gross deformities. Good, equal movement & strength bilaterally. Pulses:  Normal pulses noted. Extremities:  No clubbing or edema.  No cyanosis. Neurologic:  Alert and oriented x3;  grossly normal neurologically. Skin:  Intact without significant lesions or rashes. No jaundice. Psych:  Alert and cooperative. Normal mood and affect.  Imaging Studies: Reviewed  Assessment and Plan:   Manuel Jones is a 43 y.o. male with history of tobacco use, quit 3 months ago, with  3 months history of epigastric and right upper quadrant pain radiating to the back, ultrasound abdomen normal, CBC, LFTs normal.  Currently on aspirin 325 mg daily, history of colon cancer in his father in 31s  Epigastric/right upper quadrant pain EGD with biopsies Increase omeprazole to 20 mg twice daily Stop aspirin 325 mg, advised him that it is okay to take aspirin 81 mg daily  High risk colon cancer screening Recommend colonoscopy  I have discussed alternative options, risks & benefits,  which include, but are not limited to, bleeding, infection, perforation,respiratory complication & drug reaction.  The patient agrees with this plan & written consent will be obtained.     Follow up in 2 months   Cephas Darby, MD

## 2018-06-08 NOTE — Discharge Instructions (Signed)
General Anesthesia, Adult, Care After  This sheet gives you information about how to care for yourself after your procedure. Your health care provider may also give you more specific instructions. If you have problems or questions, contact your health care provider.  What can I expect after the procedure?  After the procedure, the following side effects are common:  Pain or discomfort at the IV site.  Nausea.  Vomiting.  Sore throat.  Trouble concentrating.  Feeling cold or chills.  Weak or tired.  Sleepiness and fatigue.  Soreness and body aches. These side effects can affect parts of the body that were not involved in surgery.  Follow these instructions at home:    For at least 24 hours after the procedure:  Have a responsible adult stay with you. It is important to have someone help care for you until you are awake and alert.  Rest as needed.  Do not:  Participate in activities in which you could fall or become injured.  Drive.  Use heavy machinery.  Drink alcohol.  Take sleeping pills or medicines that cause drowsiness.  Make important decisions or sign legal documents.  Take care of children on your own.  Eating and drinking  Follow any instructions from your health care provider about eating or drinking restrictions.  When you feel hungry, start by eating small amounts of foods that are soft and easy to digest (bland), such as toast. Gradually return to your regular diet.  Drink enough fluid to keep your urine pale yellow.  If you vomit, rehydrate by drinking water, juice, or clear broth.  General instructions  If you have sleep apnea, surgery and certain medicines can increase your risk for breathing problems. Follow instructions from your health care provider about wearing your sleep device:  Anytime you are sleeping, including during daytime naps.  While taking prescription pain medicines, sleeping medicines, or medicines that make you drowsy.  Return to your normal activities as told by your health care  provider. Ask your health care provider what activities are safe for you.  Take over-the-counter and prescription medicines only as told by your health care provider.  If you smoke, do not smoke without supervision.  Keep all follow-up visits as told by your health care provider. This is important.  Contact a health care provider if:  You have nausea or vomiting that does not get better with medicine.  You cannot eat or drink without vomiting.  You have pain that does not get better with medicine.  You are unable to pass urine.  You develop a skin rash.  You have a fever.  You have redness around your IV site that gets worse.  Get help right away if:  You have difficulty breathing.  You have chest pain.  You have blood in your urine or stool, or you vomit blood.  Summary  After the procedure, it is common to have a sore throat or nausea. It is also common to feel tired.  Have a responsible adult stay with you for the first 24 hours after general anesthesia. It is important to have someone help care for you until you are awake and alert.  When you feel hungry, start by eating small amounts of foods that are soft and easy to digest (bland), such as toast. Gradually return to your regular diet.  Drink enough fluid to keep your urine pale yellow.  Return to your normal activities as told by your health care provider. Ask your health care   provider what activities are safe for you.  This information is not intended to replace advice given to you by your health care provider. Make sure you discuss any questions you have with your health care provider.  Document Released: 07/07/2000 Document Revised: 11/14/2016 Document Reviewed: 11/14/2016  Elsevier Interactive Patient Education  2019 [employer]

## 2018-06-09 ENCOUNTER — Encounter
Admission: RE | Disposition: A | Discharge status: Home / Self Care | Payer: Self-pay | Source: Home / Self Care | Attending: Gastroenterology

## 2018-06-09 ENCOUNTER — Ambulatory Visit
Admission: RE | Admit: 2018-06-09 | Discharge: 2018-06-09 | Disposition: A | Discharge status: Home / Self Care | Payer: Commercial Managed Care - PPO | Attending: Gastroenterology | Admitting: Gastroenterology

## 2018-06-09 ENCOUNTER — Ambulatory Visit: Payer: Commercial Managed Care - PPO | Admitting: Anesthesiology

## 2018-06-09 DIAGNOSIS — Z1211 Encounter for screening for malignant neoplasm of colon: Secondary | ICD-10-CM | POA: Insufficient documentation

## 2018-06-09 DIAGNOSIS — R1013 Epigastric pain: Secondary | ICD-10-CM

## 2018-06-09 DIAGNOSIS — K208 Other esophagitis: Secondary | ICD-10-CM

## 2018-06-09 DIAGNOSIS — Z87891 Personal history of nicotine dependence: Secondary | ICD-10-CM | POA: Insufficient documentation

## 2018-06-09 DIAGNOSIS — K449 Diaphragmatic hernia without obstruction or gangrene: Secondary | ICD-10-CM | POA: Diagnosis not present

## 2018-06-09 DIAGNOSIS — K2210 Ulcer of esophagus without bleeding: Secondary | ICD-10-CM

## 2018-06-09 DIAGNOSIS — K219 Gastro-esophageal reflux disease without esophagitis: Secondary | ICD-10-CM | POA: Diagnosis not present

## 2018-06-09 DIAGNOSIS — Z79899 Other long term (current) drug therapy: Secondary | ICD-10-CM | POA: Insufficient documentation

## 2018-06-09 DIAGNOSIS — K2950 Unspecified chronic gastritis without bleeding: Secondary | ICD-10-CM | POA: Diagnosis not present

## 2018-06-09 DIAGNOSIS — Z800 Family history of malignant neoplasm of digestive organs: Secondary | ICD-10-CM | POA: Diagnosis not present

## 2018-06-09 DIAGNOSIS — K5730 Diverticulosis of large intestine without perforation or abscess without bleeding: Secondary | ICD-10-CM | POA: Insufficient documentation

## 2018-06-09 HISTORY — DX: Presence of dental prosthetic device (complete) (partial): Z97.2

## 2018-06-09 HISTORY — DX: Gastro-esophageal reflux disease without esophagitis: K21.9

## 2018-06-09 HISTORY — PX: ESOPHAGOGASTRODUODENOSCOPY (EGD) WITH PROPOFOL: SHX5813

## 2018-06-09 HISTORY — PX: COLONOSCOPY WITH PROPOFOL: SHX5780

## 2018-06-09 SURGERY — ESOPHAGOGASTRODUODENOSCOPY (EGD) WITH PROPOFOL
Anesthesia: General | Site: Throat

## 2018-06-09 MED ORDER — LACTATED RINGERS IV SOLN
INTRAVENOUS | Status: DC
  Administered 2018-06-09: 08:00:00 via INTRAVENOUS

## 2018-06-09 MED ORDER — SODIUM CHLORIDE 0.9 % IV SOLN: INTRAVENOUS | Status: DC

## 2018-06-09 MED ORDER — LIDOCAINE HCL (CARDIAC) PF 100 MG/5ML IV SOSY
PREFILLED_SYRINGE | INTRAVENOUS | Status: DC | PRN
  Administered 2018-06-09: 40 mg via INTRAVENOUS

## 2018-06-09 MED ORDER — OXYCODONE HCL 5 MG/5ML PO SOLN: 5.0000 mg | Freq: Once | ORAL | Status: DC | PRN

## 2018-06-09 MED ORDER — STERILE WATER FOR IRRIGATION IR SOLN
Status: DC | PRN
  Administered 2018-06-09 (×2)

## 2018-06-09 MED ORDER — PROPOFOL 10 MG/ML IV BOLUS
INTRAVENOUS | Status: DC | PRN
  Administered 2018-06-09 (×14): 50 mg via INTRAVENOUS

## 2018-06-09 MED ORDER — GLYCOPYRROLATE 0.2 MG/ML IJ SOLN
INTRAMUSCULAR | Status: DC | PRN
  Administered 2018-06-09: 0.2 mg via INTRAVENOUS

## 2018-06-09 MED ORDER — OXYCODONE HCL 5 MG PO TABS: 5.0000 mg | ORAL_TABLET | Freq: Once | ORAL | Status: DC | PRN

## 2018-06-09 MED ORDER — OMEPRAZOLE 40 MG PO CPDR: 40.0000 mg | DELAYED_RELEASE_CAPSULE | Freq: Two times a day (BID) | ORAL | 1 refills | Status: DC

## 2018-06-09 SURGICAL SUPPLY — 7 items
BLOCK BITE 60FR ADLT L/F GRN (MISCELLANEOUS) ×4 IMPLANT
CANISTER SUCT 1200ML W/VALVE (MISCELLANEOUS) ×4 IMPLANT
FORCEPS BIOP RAD 4 LRG CAP 4 (CUTTING FORCEPS) ×4 IMPLANT
GOWN CVR UNV OPN BCK APRN NK (MISCELLANEOUS) ×4 IMPLANT
GOWN ISOL THUMB LOOP REG UNIV (MISCELLANEOUS) ×4
KIT ENDO PROCEDURE OLY (KITS) ×4 IMPLANT
WATER STERILE IRR 250ML POUR (IV SOLUTION) ×4 IMPLANT

## 2018-06-09 NOTE — Op Note (Signed)
[facility] Gastroenterology Patient Name: Manuel Jones Procedure Date: 06/09/2018 8:54 AM MRN: [id number] Account #: [id number] Date of Birth: Aug 28, 1975 Admit Type: Outpatient Age: 43 Room: [facility] OR ROOM 01 Gender: Male Note Status: Finalized Procedure:            Upper GI endoscopy Indications:          Epigastric abdominal pain, NSAID use Providers:            Lin Landsman MD, MD Referring MD:         [place], MD (Referring MD) Medicines:            General Anesthesia Complications:        No immediate complications. Estimated blood loss: None. Procedure:            Pre-Anesthesia Assessment:                       - Prior to the procedure, a History and Physical was                        performed, and patient medications and allergies were                        reviewed. The patient is competent. The risks and                        benefits of the procedure and the sedation options and                        risks were discussed with the patient. All questions                        were answered and informed consent was obtained.                        Patient identification and proposed procedure were                        verified by the physician, the nurse, the                        anesthesiologist, the anesthetist and the technician in                        the pre-procedure area in the procedure room in the                        endoscopy suite. Mental Status Examination: alert and                        oriented. Airway Examination: normal oropharyngeal                        airway and neck mobility. Respiratory Examination:                        clear to auscultation. CV Examination: normal.                        Prophylactic Antibiotics: The patient does not require  prophylactic antibiotics. Prior Anticoagulants: The                        patient has taken no previous anticoagulant  or                        antiplatelet agents. ASA Grade Assessment: II - A                        patient with mild systemic disease. After reviewing the                        risks and benefits, the patient was deemed in                        satisfactory condition to undergo the procedure. The                        anesthesia plan was to use general anesthesia.                        Immediately prior to administration of medications, the                        patient was re-assessed for adequacy to receive                        sedatives. The heart rate, respiratory rate, oxygen                        saturations, blood pressure, adequacy of pulmonary                        ventilation, and response to care were monitored                        throughout the procedure. The physical status of the                        patient was re-assessed after the procedure.                       After obtaining informed consent, the endoscope was                        passed under direct vision. Throughout the procedure,                        the patient's blood pressure, pulse, and oxygen                        saturations were monitored continuously. The was                        introduced through the mouth, and advanced to the                        second part of duodenum. The upper GI endoscopy was  accomplished without difficulty. The patient tolerated                        the procedure well. Findings:      The duodenal bulb and second portion of the duodenum were normal.      A medium-sized hiatal hernia was present.      The entire examined stomach was normal. Biopsies were taken with a cold       forceps for Helicobacter pylori testing.      Two superficial esophageal ulcers with no bleeding and no stigmata of       recent bleeding were found at the gastroesophageal junction. The largest       lesion was 5 mm in largest dimension.      LA Grade D (one  or more mucosal breaks involving at least 75% of       esophageal circumference) esophagitis with no bleeding was found in the       lower third of the esophagus. Impression:           - Normal duodenal bulb and second portion of the                        duodenum.                       - Medium-sized hiatal hernia.                       - Normal stomach. Biopsied.                       - Non-bleeding esophageal ulcers.                       - LA Grade D erosive esophagitis. Recommendation:       - Await pathology results.                       - Use Prilosec (omeprazole) 40 mg PO BID for 3 months.                       - No ibuprofen, naproxen, or other non-steroidal                        anti-inflammatory drugs. Procedure Code(s):    --- Professional ---                       [phone], Esophagogastroduodenoscopy, flexible, transoral;                        with biopsy, single or multiple Diagnosis Code(s):    --- Professional ---                       K44.9, Diaphragmatic hernia without obstruction or                        gangrene                       K22.10, Ulcer of esophagus without bleeding                       K20.8,  Other esophagitis                       R10.13, Epigastric pain CPT copyright 2018 American Medical Association. All rights reserved. The codes documented in this report are preliminary and upon coder review may  be revised to meet current compliance requirements. Dr. Ulyess Mort Lin Landsman MD, MD 06/09/2018 9:18:15 AM This report has been signed electronically. Number of Addenda: 0 Note Initiated On: 06/09/2018 8:54 AM Total Procedure Duration: 0 hours 6 minutes 24 seconds       [facility]

## 2018-06-09 NOTE — Anesthesia Preprocedure Evaluation (Signed)
Anesthesia Evaluation  Patient identified by MRN, date of birth, ID band  Reviewed: NPO status   History of Anesthesia Complications Negative for: history of anesthetic complications  Airway Mallampati: II  TM Distance: >3 FB Neck ROM: full    Dental no notable dental hx. (+) Edentulous Upper, Upper Dentures   Pulmonary neg pulmonary ROS, former smoker (quit 3 months ago),    Pulmonary exam normal        Cardiovascular Exercise Tolerance: Good negative cardio ROS Normal cardiovascular exam     Neuro/Psych negative neurological ROS  negative psych ROS   GI/Hepatic Neg liver ROS, GERD  Medicated and Controlled,  Endo/Other  negative endocrine ROS  Renal/GU negative Renal ROS  negative genitourinary   Musculoskeletal   Abdominal   Peds  Hematology negative hematology ROS (+)   Anesthesia Other Findings   Reproductive/Obstetrics                             Anesthesia Physical Anesthesia Plan  ASA: II  Anesthesia Plan: General   Post-op Pain Management:    Induction:   PONV Risk Score and Plan:   Airway Management Planned: Natural Airway  Additional Equipment:   Intra-op Plan:   Post-operative Plan:   Informed Consent: I have reviewed the patients History and Physical, chart, labs and discussed the procedure including the risks, benefits and alternatives for the proposed anesthesia with the patient or authorized representative who has indicated his/her understanding and acceptance.       Plan Discussed with: CRNA  Anesthesia Plan Comments:         Anesthesia Quick Evaluation

## 2018-06-09 NOTE — Op Note (Signed)
[facility] Gastroenterology Patient Name: Manuel Jones Procedure Date: 06/09/2018 8:52 AM MRN: [id number] Account #: [id number] Date of Birth: [birth year] Admit Type: Outpatient Age: 43 Room: [facility] OR ROOM 01 Gender: Male Note Status: Finalized Procedure:            Colonoscopy Indications:          Screening in patient at increased risk: Colorectal                        cancer in father before age 63, This is the patient's                        first colonoscopy Providers:            Lin Landsman MD, MD Medicines:            General Anesthesia Complications:        No immediate complications. Estimated blood loss: None. Procedure:            Pre-Anesthesia Assessment:                       - Prior to the procedure, a History and Physical was                        performed, and patient medications and allergies were                        reviewed. The patient is competent. The risks and                        benefits of the procedure and the sedation options and                        risks were discussed with the patient. All questions                        were answered and informed consent was obtained.                        Patient identification and proposed procedure were                        verified by the physician, the nurse, the                        anesthesiologist, the anesthetist and the technician in                        the pre-procedure area in the procedure room in the                        endoscopy suite. Mental Status Examination: alert and                        oriented. Airway Examination: normal oropharyngeal                        airway and neck mobility. Respiratory Examination:  clear to auscultation. CV Examination: normal.                        Prophylactic Antibiotics: The patient does not require                        prophylactic antibiotics. Prior Anticoagulants: The       patient has taken no previous anticoagulant or                        antiplatelet agents. ASA Grade Assessment: II - A                        patient with mild systemic disease. After reviewing the                        risks and benefits, the patient was deemed in                        satisfactory condition to undergo the procedure. The                        anesthesia plan was to use general anesthesia.                        Immediately prior to administration of medications, the                        patient was re-assessed for adequacy to receive                        sedatives. The heart rate, respiratory rate, oxygen                        saturations, blood pressure, adequacy of pulmonary                        ventilation, and response to care were monitored                        throughout the procedure. The physical status of the                        patient was re-assessed after the procedure.                       After obtaining informed consent, the colonoscope was                        passed under direct vision. Throughout the procedure,                        the patient's blood pressure, pulse, and oxygen                        saturations were monitored continuously. The was                        introduced through the anus and advanced to the the  terminal ileum, with identification of the appendiceal                        orifice and IC valve. The colonoscopy was performed                        without difficulty. The patient tolerated the procedure                        well. The quality of the bowel preparation was                        evaluated using the BBPS [facility] Bowel Preparation                        Scale) with scores of: Right Colon = 3, Transverse                        Colon = 3 and Left Colon = 3 (entire mucosa seen well                        with no residual staining, small fragments of stool or                         opaque liquid). The total BBPS score equals 9. Findings:      The perianal and digital rectal examinations were normal. Pertinent       negatives include normal sphincter tone and no palpable rectal lesions.      The terminal ileum appeared normal.      A few diverticula were found in the sigmoid colon.      The entire examined colon appeared normal.      The retroflexed view of the distal rectum and anal verge was normal and       showed no anal or rectal abnormalities. Impression:           - The examined portion of the ileum was normal.                       - Diverticulosis in the sigmoid colon.                       - The entire examined colon is normal.                       - The distal rectum and anal verge are normal on                        retroflexion view.                       - No specimens collected. Recommendation:       - Discharge patient to home (with escort).                       - Resume previous diet today.                       - Continue present medications.                       -  Repeat colonoscopy in 5 years for surveillance. Procedure Code(s):    --- Professional ---                       V9166, Colorectal cancer screening; colonoscopy on                        individual at high risk Diagnosis Code(s):    --- Professional ---                       Z80.0, Family history of malignant neoplasm of                        digestive organs                       K57.30, Diverticulosis of large intestine without                        perforation or abscess without bleeding CPT copyright 2018 American Medical Association. All rights reserved. The codes documented in this report are preliminary and upon coder review may  be revised to meet current compliance requirements. Dr. Ulyess Mort Lin Landsman MD, MD 06/09/2018 9:35:04 AM This report has been signed electronically. Number of Addenda: 0 Note Initiated On: 06/09/2018 8:52 AM Scope Withdrawal  Time: 0 hours 9 minutes 29 seconds  Total Procedure Duration: 0 hours 11 minutes 38 seconds       [facility]

## 2018-06-09 NOTE — Transfer of Care (Signed)
Immediate Anesthesia Transfer of Care Note  Patient: Manuel Jones  Procedure(s) Performed: ESOPHAGOGASTRODUODENOSCOPY (EGD) WITH BIOPSIES (N/A Throat) COLONOSCOPY WITH PROPOFOL (N/A Rectum)  Patient Location: PACU  Anesthesia Type: General  Level of Consciousness: awake, alert  and patient cooperative  Airway and Oxygen Therapy: Patient Spontanous Breathing and Patient connected to supplemental oxygen  Post-op Assessment: Post-op Vital signs reviewed, Patient's Cardiovascular Status Stable, Respiratory Function Stable, Patent Airway and No signs of Nausea or vomiting  Post-op Vital Signs: Reviewed and stable  Complications: No apparent anesthesia complications

## 2018-06-09 NOTE — H&P (Signed)
Manuel Darby, MD [address]  [place]  [place], [place] [address]  Main: [phone]  Fax: [phone] Pager: [phone]  Primary Care Physician:  Durward Parcel, MD Primary Gastroenterologist:  Dr. Cephas Jones  Pre-Procedure History & Physical: HPI:  Manuel Jones is a 43 y.o. male is here for an endoscopy and colonoscopy.   Past Medical History:  Diagnosis Date  . GERD (gastroesophageal reflux disease)   . Wears dentures    full upper    Past Surgical History:  Procedure Laterality Date  . APPENDECTOMY      Prior to Admission medications   Medication Sig Start Date End Date Taking? Authorizing Provider  omeprazole (PRILOSEC) 20 MG capsule Take 20 mg by mouth daily.   Yes [provider]    Allergies as of 06/02/2018  . (No Known Allergies)    Family History  Problem Relation Age of Onset  . Colon cancer Father     Social History   Socioeconomic History  . Marital status: Single    Spouse name: Not on file  . Number of children: Not on file  . Years of education: Not on file  . Highest education level: Not on file  Occupational History  . Not on file  Social Needs  . Financial resource strain: Not on file  . Food insecurity:    Worry: Not on file    Inability: Not on file  . Transportation needs:    Medical: Not on file    Non-medical: Not on file  Tobacco Use  . Smoking status: Former Smoker    Packs/day: 0.50    Years: 18.00    Pack years: 9.00    Types: Cigarettes    Last attempt to quit: 04/13/2018    Years since quitting: 0.1  . Smokeless tobacco: Never Used  Substance and Sexual Activity  . Alcohol use: Not Currently  . Drug use: Not Currently  . Sexual activity: Not on file  Lifestyle  . Physical activity:    Days per week: Not on file    Minutes per session: Not on file  . Stress: Not on file  Relationships  . Social connections:    Talks on phone: Not on file    Gets together: Not on file    Attends  religious service: Not on file    Active member of club or organization: Not on file    Attends meetings of clubs or organizations: Not on file    Relationship status: Not on file  . Intimate partner violence:    Fear of current or ex partner: Not on file    Emotionally abused: Not on file    Physically abused: Not on file    Forced sexual activity: Not on file  Other Topics Concern  . Not on file  Social History Narrative  . Not on file    Review of Systems: See HPI, otherwise negative ROS  Physical Exam: BP 123/76   Pulse (!) 58   Temp 97.9 F (36.6 C) (Temporal)   Resp 16   Ht 6' (1.829 m)   Wt 97.5 kg   SpO2 100%   BMI 29.16 kg/m  General:   Alert,  pleasant and cooperative in NAD Head:  Normocephalic and atraumatic. Neck:  Supple; no masses or thyromegaly. Lungs:  Clear throughout to auscultation.    Heart:  Regular rate and rhythm. Abdomen:  Soft, nontender and nondistended. Normal bowel sounds, without guarding, and without rebound.  Neurologic:  Alert and  oriented x4;  grossly normal neurologically.  Impression/Plan: Manuel Jones is here for an endoscopy and colonoscopy to be performed for epigastric pain, high risk colon cancer screening  Risks, benefits, limitations, and alternatives regarding  endoscopy and colonoscopy have been reviewed with the patient.  Questions have been answered.  All parties agreeable.   [name], MD  06/09/2018, 8:44 AM

## 2018-06-09 NOTE — Anesthesia Postprocedure Evaluation (Signed)
Anesthesia Post Note  Patient: Manuel Jones  Procedure(s) Performed: ESOPHAGOGASTRODUODENOSCOPY (EGD) WITH BIOPSIES (N/A Throat) COLONOSCOPY WITH PROPOFOL (N/A Rectum)  Patient location during evaluation: PACU Anesthesia Type: General Level of consciousness: awake and alert Pain management: pain level controlled Vital Signs Assessment: post-procedure vital signs reviewed and stable Respiratory status: spontaneous breathing, nonlabored ventilation, respiratory function stable and patient connected to nasal cannula oxygen Cardiovascular status: blood pressure returned to baseline and stable Postop Assessment: no apparent nausea or vomiting Anesthetic complications: no    Manmeet Arzola

## 2018-06-10 ENCOUNTER — Encounter: Payer: Self-pay | Admitting: Gastroenterology

## 2018-07-28 ENCOUNTER — Other Ambulatory Visit: Payer: Self-pay

## 2018-07-28 ENCOUNTER — Encounter: Payer: Self-pay | Admitting: Gastroenterology

## 2018-07-28 ENCOUNTER — Ambulatory Visit (INDEPENDENT_AMBULATORY_CARE_PROVIDER_SITE_OTHER): Payer: Commercial Managed Care - PPO | Admitting: Gastroenterology

## 2018-07-28 DIAGNOSIS — K2210 Ulcer of esophagus without bleeding: Secondary | ICD-10-CM | POA: Diagnosis not present

## 2018-07-28 DIAGNOSIS — G8929 Other chronic pain: Secondary | ICD-10-CM

## 2018-07-28 DIAGNOSIS — R1013 Epigastric pain: Secondary | ICD-10-CM | POA: Diagnosis not present

## 2018-07-28 NOTE — Progress Notes (Signed)
Manuel Sear, MD [address]  [place]  [place], [place] [address]  Main: [phone]  Fax: [phone]    Gastroenterology Consultation Video Visit  Referring Provider:     Durward Parcel, MD Primary Care Physician:  Durward Parcel, MD Primary Gastroenterologist:  Dr. Cephas Darby Reason for Consultation:     Esophagitis, epigastric/right upper quadrant pain        HPI:   Manuel Jones is a 43 y.o. male referred by Dr. Durward Parcel, MD  for consultation & management of epigastric/right upper quadrant pain  Virtual Visit via Telephone Note  I connected with Manuel Jones on 07/28/18 at 10:30 AM EDT by video and verified that I am speaking with the correct person using two identifiers.   I discussed the limitations, risks, security and privacy concerns of performing an evaluation and management service by video and the availability of in person appointments. I also discussed with the patient that there may be a patient responsible charge related to this service. The patient expressed understanding and agreed to proceed.  Location of the Patient: Home  Location of the provider: Home office   History of Present Illness:  Mr.Manuel Jones has been seeing me for chronic epigastric/right upper quadrant pain.  He underwent EGD and colonoscopy.  EGD revealed esophageal ulcers and LA grade D esophagitis.  I started him on omeprazole 40 mg twice daily after the EGD which he has been compliant with.  He reports the epigastric pain has slightly improved, 4/10 in severity and sometimes the pain radiates to right upper quadrant as well as to the back.  Ultrasound right upper quadrant was unremarkable for cholelithiasis.  He denies any heartburn.  Sometimes he notices worsening of pain after eating.  He denies any change in his bowel habits, he denies bloating, weight loss or loss of appetite. He quit smoking about 4 months ago, he is not taking alcohol NSAIDs: None   Antiplts/Anticoagulants/Anti thrombotics: None  GI Procedures: EGD and colonoscopy 06/09/2018 - Normal duodenal bulb and second portion of the duodenum. - Medium-sized hiatal hernia. - Normal stomach. Biopsied. - Non-bleeding esophageal ulcers. - LA Grade D erosive esophagitis.    - The examined portion of the ileum was normal. - Diverticulosis in the sigmoid colon. - The entire examined colon is normal. - The distal rectum and anal verge are normal on retroflexion view. - No specimens collected.  Past Medical History:  Diagnosis Date  . GERD (gastroesophageal reflux disease)   . Wears dentures    full upper    Past Surgical History:  Procedure Laterality Date  . APPENDECTOMY    . COLONOSCOPY WITH PROPOFOL N/A 06/09/2018   Procedure: COLONOSCOPY WITH PROPOFOL;  Surgeon: Lin Landsman, MD;  Location: [facility];  Service: Endoscopy;  Laterality: N/A;  . ESOPHAGOGASTRODUODENOSCOPY (EGD) WITH PROPOFOL N/A 06/09/2018   Procedure: ESOPHAGOGASTRODUODENOSCOPY (EGD) WITH BIOPSIES;  Surgeon: Lin Landsman, MD;  Location: [facility];  Service: Endoscopy;  Laterality: N/A;     Current Outpatient Medications:  .  fluticasone (FLONASE) 50 MCG/ACT nasal spray, USE 2 SPRAY(S) IN EACH NOSTRIL ONCE DAILY FOR ALLERGIES, Disp: , Rfl:  .  omeprazole (PRILOSEC) 40 MG capsule, Take 1 capsule (40 mg total) by mouth 2 (two) times daily before a meal., Disp: 180 capsule, Rfl: 1   Family History  Problem Relation Age of Onset  . Colon cancer Father      Social History   Tobacco Use  . Smoking status:  Former Smoker    Packs/day: 0.50    Years: 18.00    Pack years: 9.00    Types: Cigarettes    Last attempt to quit: 04/13/2018    Years since quitting: 0.2  . Smokeless tobacco: Never Used  Substance Use Topics  . Alcohol use: Not Currently  . Drug use: Not Currently    Allergies as of 07/28/2018  . (No Known Allergies)     Imaging Studies: Reviewed   Assessment and Plan:   [name] is a 43 y.o. male with history of tobacco use, quit smoking 4 months ago, with no other past medical history is seen as a video visit for follow-up of chronic epigastric/right upper quadrant pain.  EGD revealed severe erosive esophagitis and esophageal ulcers.  Symptoms have mildly improved on omeprazole 40 mg twice daily  Continue omeprazole 40 mg twice daily for next 2 to 3 months Recommend EGD in june to confirm healing of the esophagitis and ulcers, take biopsies for Barrett's screening, to rule out underlying Barrett's If patient has ongoing pain despite healing of the esophagitis, recommend CT abdomen and pelvis pancreas protocol after the EGD  Follow Up Instructions:   I discussed the assessment and treatment plan with the patient. The patient was provided an opportunity to ask questions and all were answered. The patient agreed with the plan and demonstrated an understanding of the instructions.   The patient was advised to call back or seek an in-person evaluation if the symptoms worsen or if the condition fails to improve as anticipated.  I provided 15 minutes of face-to-face time during this encounter.   Follow up in 2 to 3 months   Cephas Darby, MD

## 2018-07-29 ENCOUNTER — Telehealth: Payer: Self-pay | Admitting: Gastroenterology

## 2018-07-29 NOTE — Telephone Encounter (Signed)
I l/m for patient to call & schedule an appointment with Dr Marius Ditch in 3 mon for f/u.

## 2018-08-04 ENCOUNTER — Telehealth: Payer: Self-pay | Admitting: Gastroenterology

## 2018-08-04 ENCOUNTER — Ambulatory Visit: Payer: Commercial Managed Care - PPO | Admitting: Gastroenterology

## 2018-08-04 NOTE — Telephone Encounter (Signed)
I called patient & l/m to call & schedule a 3 month f/u with Dr Link Snuffer her request)

## 2018-08-12 ENCOUNTER — Other Ambulatory Visit: Payer: Self-pay

## 2018-08-12 DIAGNOSIS — K209 Esophagitis, unspecified without bleeding: Secondary | ICD-10-CM

## 2018-09-14 ENCOUNTER — Other Ambulatory Visit: Payer: Self-pay

## 2018-09-14 DIAGNOSIS — K209 Esophagitis, unspecified without bleeding: Secondary | ICD-10-CM

## 2018-09-20 ENCOUNTER — Ambulatory Visit
Admission: RE | Admit: 2018-09-20 | Payer: Commercial Managed Care - PPO | Source: Home / Self Care | Admitting: Gastroenterology

## 2018-09-20 ENCOUNTER — Encounter: Admission: RE | Payer: Self-pay | Source: Home / Self Care

## 2018-09-20 SURGERY — ESOPHAGOGASTRODUODENOSCOPY (EGD) WITH PROPOFOL
Anesthesia: General

## 2018-09-23 ENCOUNTER — Other Ambulatory Visit: Payer: Self-pay

## 2018-09-23 ENCOUNTER — Encounter: Payer: Self-pay | Admitting: *Deleted

## 2018-09-24 ENCOUNTER — Other Ambulatory Visit
Admission: RE | Admit: 2018-09-24 | Discharge: 2018-09-24 | Disposition: A | Discharge status: Home / Self Care | Payer: Commercial Managed Care - PPO | Source: Ambulatory Visit | Attending: Gastroenterology | Admitting: Gastroenterology

## 2018-09-24 DIAGNOSIS — Z01812 Encounter for preprocedural laboratory examination: Secondary | ICD-10-CM | POA: Diagnosis not present

## 2018-09-24 DIAGNOSIS — Z1159 Encounter for screening for other viral diseases: Secondary | ICD-10-CM | POA: Diagnosis not present

## 2018-09-25 LAB — NOVEL CORONAVIRUS, NAA (HOSP ORDER, SEND-OUT TO REF LAB; TAT 18-24 HRS): SARS-CoV-2, NAA: NOT DETECTED

## 2018-09-28 NOTE — Discharge Instructions (Signed)
General Anesthesia, Adult, Care After  This sheet gives you information about how to care for yourself after your procedure. Your health care provider may also give you more specific instructions. If you have problems or questions, contact your health care provider.  What can I expect after the procedure?  After the procedure, the following side effects are common:  Pain or discomfort at the IV site.  Nausea.  Vomiting.  Sore throat.  Trouble concentrating.  Feeling cold or chills.  Weak or tired.  Sleepiness and fatigue.  Soreness and body aches. These side effects can affect parts of the body that were not involved in surgery.  Follow these instructions at home:    For at least 24 hours after the procedure:  Have a responsible adult stay with you. It is important to have someone help care for you until you are awake and alert.  Rest as needed.  Do not:  Participate in activities in which you could fall or become injured.  Drive.  Use heavy machinery.  Drink alcohol.  Take sleeping pills or medicines that cause drowsiness.  Make important decisions or sign legal documents.  Take care of children on your own.  Eating and drinking  Follow any instructions from your health care provider about eating or drinking restrictions.  When you feel hungry, start by eating small amounts of foods that are soft and easy to digest (bland), such as toast. Gradually return to your regular diet.  Drink enough fluid to keep your urine pale yellow.  If you vomit, rehydrate by drinking water, juice, or clear broth.  General instructions  If you have sleep apnea, surgery and certain medicines can increase your risk for breathing problems. Follow instructions from your health care provider about wearing your sleep device:  Anytime you are sleeping, including during daytime naps.  While taking prescription pain medicines, sleeping medicines, or medicines that make you drowsy.  Return to your normal activities as told by your health care  provider. Ask your health care provider what activities are safe for you.  Take over-the-counter and prescription medicines only as told by your health care provider.  If you smoke, do not smoke without supervision.  Keep all follow-up visits as told by your health care provider. This is important.  Contact a health care provider if:  You have nausea or vomiting that does not get better with medicine.  You cannot eat or drink without vomiting.  You have pain that does not get better with medicine.  You are unable to pass urine.  You develop a skin rash.  You have a fever.  You have redness around your IV site that gets worse.  Get help right away if:  You have difficulty breathing.  You have chest pain.  You have blood in your urine or stool, or you vomit blood.  Summary  After the procedure, it is common to have a sore throat or nausea. It is also common to feel tired.  Have a responsible adult stay with you for the first 24 hours after general anesthesia. It is important to have someone help care for you until you are awake and alert.  When you feel hungry, start by eating small amounts of foods that are soft and easy to digest (bland), such as toast. Gradually return to your regular diet.  Drink enough fluid to keep your urine pale yellow.  Return to your normal activities as told by your health care provider. Ask your health care   provider what activities are safe for you.  This information is not intended to replace advice given to you by your health care provider. Make sure you discuss any questions you have with your health care provider.  Document Released: 07/07/2000 Document Revised: 11/14/2016 Document Reviewed: 11/14/2016  Elsevier Interactive Patient Education  2019 [employer]

## 2018-09-29 ENCOUNTER — Other Ambulatory Visit: Payer: Self-pay

## 2018-09-29 ENCOUNTER — Ambulatory Visit
Admission: RE | Admit: 2018-09-29 | Discharge: 2018-09-29 | Disposition: A | Discharge status: Home / Self Care | Payer: Commercial Managed Care - PPO | Attending: Gastroenterology | Admitting: Gastroenterology

## 2018-09-29 ENCOUNTER — Encounter
Admission: RE | Disposition: A | Discharge status: Home / Self Care | Payer: Self-pay | Source: Home / Self Care | Attending: Gastroenterology

## 2018-09-29 ENCOUNTER — Ambulatory Visit: Payer: Commercial Managed Care - PPO | Admitting: Anesthesiology

## 2018-09-29 DIAGNOSIS — K210 Gastro-esophageal reflux disease with esophagitis: Secondary | ICD-10-CM | POA: Diagnosis not present

## 2018-09-29 DIAGNOSIS — Z87891 Personal history of nicotine dependence: Secondary | ICD-10-CM | POA: Diagnosis not present

## 2018-09-29 DIAGNOSIS — Z13810 Encounter for screening for upper gastrointestinal disorder: Secondary | ICD-10-CM

## 2018-09-29 DIAGNOSIS — K219 Gastro-esophageal reflux disease without esophagitis: Secondary | ICD-10-CM

## 2018-09-29 DIAGNOSIS — K449 Diaphragmatic hernia without obstruction or gangrene: Secondary | ICD-10-CM | POA: Diagnosis not present

## 2018-09-29 DIAGNOSIS — K209 Esophagitis, unspecified: Secondary | ICD-10-CM

## 2018-09-29 DIAGNOSIS — Z09 Encounter for follow-up examination after completed treatment for conditions other than malignant neoplasm: Secondary | ICD-10-CM | POA: Diagnosis not present

## 2018-09-29 HISTORY — PX: ESOPHAGOGASTRODUODENOSCOPY (EGD) WITH PROPOFOL: SHX5813

## 2018-09-29 SURGERY — ESOPHAGOGASTRODUODENOSCOPY (EGD) WITH PROPOFOL
Anesthesia: General

## 2018-09-29 MED ORDER — LIDOCAINE HCL (CARDIAC) PF 100 MG/5ML IV SOSY
PREFILLED_SYRINGE | INTRAVENOUS | Status: DC | PRN
  Administered 2018-09-29: 40 mg via INTRAVENOUS

## 2018-09-29 MED ORDER — STERILE WATER FOR IRRIGATION IR SOLN
Status: DC | PRN
  Administered 2018-09-29: 5 mL

## 2018-09-29 MED ORDER — OXYCODONE HCL 5 MG/5ML PO SOLN: 5.0000 mg | Freq: Once | ORAL | Status: DC | PRN

## 2018-09-29 MED ORDER — LACTATED RINGERS IV SOLN
INTRAVENOUS | Status: DC
  Administered 2018-09-29: 08:00:00 via INTRAVENOUS

## 2018-09-29 MED ORDER — DEXMEDETOMIDINE HCL 200 MCG/2ML IV SOLN
INTRAVENOUS | Status: DC | PRN
  Administered 2018-09-29: 6 ug via INTRAVENOUS

## 2018-09-29 MED ORDER — GLYCOPYRROLATE 0.2 MG/ML IJ SOLN
INTRAMUSCULAR | Status: DC | PRN
  Administered 2018-09-29: 0.1 mg via INTRAVENOUS

## 2018-09-29 MED ORDER — OXYCODONE HCL 5 MG PO TABS: 5.0000 mg | ORAL_TABLET | Freq: Once | ORAL | Status: DC | PRN

## 2018-09-29 MED ORDER — PROPOFOL 10 MG/ML IV BOLUS
INTRAVENOUS | Status: DC | PRN
  Administered 2018-09-29 (×4): 100 mg via INTRAVENOUS

## 2018-09-29 SURGICAL SUPPLY — 33 items
BALLN DILATOR 10-12 8 (BALLOONS)
BALLN DILATOR 12-15 8 (BALLOONS)
BALLN DILATOR 15-18 8 (BALLOONS)
BALLN DILATOR CRE 0-12 8 (BALLOONS)
BALLN DILATOR ESOPH 8 10 CRE (MISCELLANEOUS) IMPLANT
BALLOON DILATOR 12-15 8 (BALLOONS) IMPLANT
BALLOON DILATOR 15-18 8 (BALLOONS) IMPLANT
BALLOON DILATOR CRE 0-12 8 (BALLOONS) IMPLANT
BLOCK BITE 60FR ADLT L/F GRN (MISCELLANEOUS) ×3 IMPLANT
CANISTER SUCT 1200ML W/VALVE (MISCELLANEOUS) ×3 IMPLANT
CLIP HMST 235XBRD CATH ROT (MISCELLANEOUS) IMPLANT
CLIP RESOLUTION 360 11X235 (MISCELLANEOUS)
ELECT REM PT RETURN 9FT ADLT (ELECTROSURGICAL)
ELECTRODE REM PT RTRN 9FT ADLT (ELECTROSURGICAL) IMPLANT
FCP ESCP3.2XJMB 240X2.8X (MISCELLANEOUS)
FORCEPS BIOP RAD 4 LRG CAP 4 (CUTTING FORCEPS) IMPLANT
FORCEPS BIOP RJ4 240 W/NDL (MISCELLANEOUS)
FORCEPS ESCP3.2XJMB 240X2.8X (MISCELLANEOUS) IMPLANT
GOWN CVR UNV OPN BCK APRN NK (MISCELLANEOUS) ×2 IMPLANT
GOWN ISOL THUMB LOOP REG UNIV (MISCELLANEOUS) ×4
INJECTOR VARIJECT VIN23 (MISCELLANEOUS) IMPLANT
KIT DEFENDO VALVE AND CONN (KITS) IMPLANT
KIT ENDO PROCEDURE OLY (KITS) ×3 IMPLANT
MARKER SPOT ENDO TATTOO 5ML (MISCELLANEOUS) IMPLANT
RETRIEVER NET PLAT FOOD (MISCELLANEOUS) IMPLANT
SNARE SHORT THROW 13M SML OVAL (MISCELLANEOUS) IMPLANT
SNARE SHORT THROW 30M LRG OVAL (MISCELLANEOUS) IMPLANT
SPOT EX ENDOSCOPIC TATTOO (MISCELLANEOUS)
SYR INFLATION 60ML (SYRINGE) IMPLANT
TRAP ETRAP POLY (MISCELLANEOUS) IMPLANT
VARIJECT INJECTOR VIN23 (MISCELLANEOUS)
WATER STERILE IRR 250ML POUR (IV SOLUTION) ×3 IMPLANT
WIRE CRE 18-20MM 8CM F G (MISCELLANEOUS) IMPLANT

## 2018-09-29 NOTE — Transfer of Care (Signed)
Immediate Anesthesia Transfer of Care Note  Patient: Manuel Jones  Procedure(s) Performed: ESOPHAGOGASTRODUODENOSCOPY (EGD) WITH PROPOFOL (N/A )  Patient Location: PACU  Anesthesia Type: General  Level of Consciousness: awake, alert  and patient cooperative  Airway and Oxygen Therapy: Patient Spontanous Breathing and Patient connected to supplemental oxygen  Post-op Assessment: Post-op Vital signs reviewed, Patient's Cardiovascular Status Stable, Respiratory Function Stable, Patent Airway and No signs of Nausea or vomiting  Post-op Vital Signs: Reviewed and stable  Complications: No apparent anesthesia complications

## 2018-09-29 NOTE — Anesthesia Postprocedure Evaluation (Signed)
Anesthesia Post Note  Patient: Manuel Jones  Procedure(s) Performed: ESOPHAGOGASTRODUODENOSCOPY (EGD) WITH PROPOFOL (N/A )  Patient location during evaluation: PACU Anesthesia Type: General Level of consciousness: awake and alert Pain management: pain level controlled Vital Signs Assessment: post-procedure vital signs reviewed and stable Respiratory status: spontaneous breathing, nonlabored ventilation, respiratory function stable and patient connected to nasal cannula oxygen Cardiovascular status: blood pressure returned to baseline and stable Postop Assessment: no apparent nausea or vomiting Anesthetic complications: no    Ripley Lovecchio

## 2018-09-29 NOTE — Anesthesia Procedure Notes (Signed)
Procedure Name: MAC Date/Time: 09/29/2018 9:12 AM Performed by: Janna Arch, CRNA Pre-anesthesia Checklist: Patient identified, Emergency Drugs available, Suction available, Timeout performed and Patient being monitored Patient Re-evaluated:Patient Re-evaluated prior to induction Oxygen Delivery Method: Nasal cannula Placement Confirmation: positive ETCO2

## 2018-09-29 NOTE — H&P (Signed)
Cephas Darby, MD [address]  [place]  [place], [place] [address]  Main: [phone]  Fax: [phone] Pager: [phone]  Primary Care Physician:  Durward Parcel, MD Primary Gastroenterologist:  Dr. Cephas Darby  Pre-Procedure History & Physical: HPI:  Manuel Jones is a 43 y.o. male is here for an endoscopy.   Past Medical History:  Diagnosis Date  . GERD (gastroesophageal reflux disease)   . Wears dentures    full upper    Past Surgical History:  Procedure Laterality Date  . APPENDECTOMY    . COLONOSCOPY WITH PROPOFOL N/A 06/09/2018   Procedure: COLONOSCOPY WITH PROPOFOL;  Surgeon: Lin Landsman, MD;  Location: [facility];  Service: Endoscopy;  Laterality: N/A;  . ESOPHAGOGASTRODUODENOSCOPY (EGD) WITH PROPOFOL N/A 06/09/2018   Procedure: ESOPHAGOGASTRODUODENOSCOPY (EGD) WITH BIOPSIES;  Surgeon: Lin Landsman, MD;  Location: [facility];  Service: Endoscopy;  Laterality: N/A;    Prior to Admission medications   Medication Sig Start Date End Date Taking? Authorizing Provider  fexofenadine-pseudoephedrine (ALLEGRA-D 24) 180-240 MG 24 hr tablet Take 1 tablet by mouth daily.   Yes [provider]  fluticasone (FLONASE) 50 MCG/ACT nasal spray USE 2 SPRAY(S) IN EACH NOSTRIL ONCE DAILY FOR ALLERGIES 07/22/18  Yes [provider]  omeprazole (PRILOSEC) 40 MG capsule Take 1 capsule (40 mg total) by mouth 2 (two) times daily before a meal. 06/09/18 09/23/18 Yes , Tally Due, MD    Allergies as of 09/14/2018  . (No Known Allergies)    Family History  Problem Relation Age of Onset  . Colon cancer Father     Social History   Socioeconomic History  . Marital status: Single    Spouse name: Not on file  . Number of children: Not on file  . Years of education: Not on file  . Highest education level: Not on file  Occupational History  . Not on file  Social Needs  . Financial resource strain: Not on file  .  Food insecurity    Worry: Not on file    Inability: Not on file  . Transportation needs    Medical: Not on file    Non-medical: Not on file  Tobacco Use  . Smoking status: Former Smoker    Packs/day: 0.50    Years: 18.00    Pack years: 9.00    Types: Cigarettes    Quit date: 04/13/2018    Years since quitting: 0.4  . Smokeless tobacco: Never Used  Substance and Sexual Activity  . Alcohol use: Not Currently  . Drug use: Not Currently  . Sexual activity: Not on file  Lifestyle  . Physical activity    Days per week: Not on file    Minutes per session: Not on file  . Stress: Not on file  Relationships  . Social Herbalist on phone: Not on file    Gets together: Not on file    Attends religious service: Not on file    Active member of club or organization: Not on file    Attends meetings of clubs or organizations: Not on file    Relationship status: Not on file  . Intimate partner violence    Fear of current or ex partner: Not on file    Emotionally abused: Not on file    Physically abused: Not on file    Forced sexual activity: Not on file  Other Topics Concern  . Not on file  Social  History Narrative  . Not on file    Review of Systems: See HPI, otherwise negative ROS  Physical Exam: BP 125/85   Pulse (!) 57   Temp 97.9 F (36.6 C) (Temporal)   Resp 18   Ht 6' (1.829 m)   Wt 100.2 kg   SpO2 100%   BMI 29.97 kg/m  General:   Alert,  pleasant and cooperative in NAD Head:  Normocephalic and atraumatic. Neck:  Supple; no masses or thyromegaly. Lungs:  Clear throughout to auscultation.    Heart:  Regular rate and rhythm. Abdomen:  Soft, nontender and nondistended. Normal bowel sounds, without guarding, and without rebound.   Neurologic:  Alert and  oriented x4;  grossly normal neurologically.  Impression/Plan: Marica Otter is here for an endoscopy to be performed for f/u of esophagitis  Risks, benefits, limitations, and alternatives  regarding  endoscopy have been reviewed with the patient.  Questions have been answered.  All parties agreeable.   [name], MD  09/29/2018, 8:14 AM

## 2018-09-29 NOTE — Op Note (Signed)
[facility] Gastroenterology Patient Name: Manuel Jones Procedure Date: 09/29/2018 9:09 AM MRN: [id number] Account #: [id number] Date of Birth: 1975-05-10 Admit Type: Outpatient Age: 43 Room: [facility] OR ROOM 01 Gender: Male Note Status: Finalized Procedure:            Upper GI endoscopy Indications:          Screening for Barrett's esophagus, Follow-up of                        esophagitis Providers:            Lin Landsman MD, MD Referring MD:         Cloudcroft, MD (Referring MD) Medicines:            Monitored Anesthesia Care Complications:        No immediate complications. Estimated blood loss: None. Procedure:            Pre-Anesthesia Assessment:                       - Prior to the procedure, a History and Physical was                        performed, and patient medications and allergies were                        reviewed. The patient is competent. The risks and                        benefits of the procedure and the sedation options and                        risks were discussed with the patient. All questions                        were answered and informed consent was obtained.                        Patient identification and proposed procedure were                        verified by the physician, the nurse, the                        anesthesiologist, the anesthetist and the technician in                        the pre-procedure area in the procedure room in the                        endoscopy suite. Mental Status Examination: alert and                        oriented. Airway Examination: normal oropharyngeal                        airway and neck mobility. Respiratory Examination:                        clear to auscultation. CV Examination: normal.  Prophylactic Antibiotics: The patient does not require                        prophylactic antibiotics. Prior Anticoagulants: The             patient has taken no previous anticoagulant or                        antiplatelet agents. ASA Grade Assessment: II - A                        patient with mild systemic disease. After reviewing the                        risks and benefits, the patient was deemed in                        satisfactory condition to undergo the procedure. The                        anesthesia plan was to use monitored anesthesia care                        (MAC). Immediately prior to administration of                        medications, the patient was re-assessed for adequacy                        to receive sedatives. The heart rate, respiratory rate,                        oxygen saturations, blood pressure, adequacy of                        pulmonary ventilation, and response to care were                        monitored throughout the procedure. The physical status                        of the patient was re-assessed after the procedure.                       After obtaining informed consent, the endoscope was                        passed under direct vision. Throughout the procedure,                        the patient's blood pressure, pulse, and oxygen                        saturations were monitored continuously. The Endoscope                        was introduced through the mouth, and advanced to the                        second part of duodenum. The upper GI endoscopy was  accomplished without difficulty. The patient tolerated                        the procedure well. Findings:      The duodenal bulb and second portion of the duodenum were normal.      A 1 cm hiatal hernia was present.      The entire examined stomach was normal.      The cardia and gastric fundus were normal on retroflexion.      The gastroesophageal junction and examined esophagus were normal. Impression:           - Normal duodenal bulb and second portion of the                         duodenum.                       - 1 cm hiatal hernia.                       - Normal stomach.                       - Normal gastroesophageal junction and esophagus.                       - No specimens collected. Recommendation:       - Discharge patient to home (with escort).                       - Resume previous diet today.                       - Use Prilosec (omeprazole) 40 mg PO daily for 2 weeks. Procedure Code(s):    --- Professional ---                       [phone], Esophagogastroduodenoscopy, flexible, transoral;                        diagnostic, including collection of specimen(s) by                        brushing or washing, when performed (separate procedure) Diagnosis Code(s):    --- Professional ---                       K44.9, Diaphragmatic hernia without obstruction or                        gangrene                       Z13.810, Encounter for screening for upper                        gastrointestinal disorder                       K20.9, Esophagitis, unspecified CPT copyright 2019 American Medical Association. All rights reserved. The codes documented in this report are preliminary and upon coder review may  be revised to meet current compliance requirements. Dr. Ulyess Mort Lin Landsman MD, MD 09/29/2018 9:23:24 AM This report has been  signed electronically. Number of Addenda: 0 Note Initiated On: 09/29/2018 9:09 AM Total Procedure Duration: 0 hours 3 minutes 19 seconds  Estimated Blood Loss: Estimated blood loss: none.      [facility]

## 2018-09-29 NOTE — Anesthesia Preprocedure Evaluation (Signed)
Anesthesia Evaluation  Patient identified by MRN, date of birth, ID band  Reviewed: NPO status   History of Anesthesia Complications Negative for: history of anesthetic complications  Airway Mallampati: II  TM Distance: >3 FB Neck ROM: full    Dental  (+) Edentulous Upper, Upper Dentures   Pulmonary neg pulmonary ROS, former smoker,    Pulmonary exam normal        Cardiovascular Exercise Tolerance: Good negative cardio ROS Normal cardiovascular exam     Neuro/Psych negative neurological ROS  negative psych ROS   GI/Hepatic Neg liver ROS, GERD  Medicated and Controlled,  Endo/Other  negative endocrine ROS  Renal/GU negative Renal ROS  negative genitourinary   Musculoskeletal   Abdominal   Peds  Hematology negative hematology ROS (+)   Anesthesia Other Findings   Reproductive/Obstetrics                             Anesthesia Physical  Anesthesia Plan  ASA: II  Anesthesia Plan: General   Post-op Pain Management:    Induction:   PONV Risk Score and Plan:   Airway Management Planned: Natural Airway  Additional Equipment:   Intra-op Plan:   Post-operative Plan:   Informed Consent: I have reviewed the patients History and Physical, chart, labs and discussed the procedure including the risks, benefits and alternatives for the proposed anesthesia with the patient or authorized representative who has indicated his/her understanding and acceptance.       Plan Discussed with: CRNA  Anesthesia Plan Comments:         Anesthesia Quick Evaluation

## 2018-09-30 ENCOUNTER — Encounter: Payer: Self-pay | Admitting: Gastroenterology

## 2018-10-20 ENCOUNTER — Other Ambulatory Visit: Payer: Self-pay

## 2018-10-20 DIAGNOSIS — F1011 Alcohol abuse, in remission: Secondary | ICD-10-CM

## 2018-10-20 DIAGNOSIS — G8929 Other chronic pain: Secondary | ICD-10-CM

## 2018-10-20 NOTE — Progress Notes (Signed)
CT has been scheduled for 10/26/18 @ 1:30pm, pt has been notified

## 2018-10-26 ENCOUNTER — Ambulatory Visit
Admission: RE | Admit: 2018-10-26 | Discharge: 2018-10-26 | Disposition: A | Discharge status: Home / Self Care | Payer: Commercial Managed Care - PPO | Source: Ambulatory Visit | Attending: Gastroenterology | Admitting: Gastroenterology

## 2018-10-26 ENCOUNTER — Other Ambulatory Visit: Payer: Self-pay

## 2018-10-26 DIAGNOSIS — F1011 Alcohol abuse, in remission: Secondary | ICD-10-CM | POA: Insufficient documentation

## 2018-10-26 DIAGNOSIS — R1013 Epigastric pain: Secondary | ICD-10-CM | POA: Insufficient documentation

## 2018-10-26 DIAGNOSIS — G8929 Other chronic pain: Secondary | ICD-10-CM | POA: Diagnosis present

## 2018-10-26 MED ORDER — IOHEXOL 300 MG/ML  SOLN
100.0000 mL | Freq: Once | INTRAMUSCULAR | Status: AC | PRN
  Administered 2018-10-26: 14:00:00 100 mL via INTRAVENOUS

## 2018-10-27 ENCOUNTER — Other Ambulatory Visit: Payer: Self-pay

## 2018-10-27 ENCOUNTER — Ambulatory Visit (INDEPENDENT_AMBULATORY_CARE_PROVIDER_SITE_OTHER): Payer: Commercial Managed Care - PPO | Admitting: Gastroenterology

## 2018-10-27 DIAGNOSIS — G8929 Other chronic pain: Secondary | ICD-10-CM | POA: Diagnosis not present

## 2018-10-27 DIAGNOSIS — R1013 Epigastric pain: Secondary | ICD-10-CM | POA: Diagnosis not present

## 2018-10-27 NOTE — Progress Notes (Signed)
Manuel Sear, MD [address]  [place]  [place], [place] [address]  Main: [phone]  Fax: [phone]    Gastroenterology Consultation Video Visit  Referring Provider:     Durward Parcel, MD Primary Care Physician:  Durward Parcel, MD Primary Gastroenterologist:  Dr. Cephas Darby Reason for Consultation:     Esophagitis, epigastric/right upper quadrant pain        HPI:   Manuel Jones is a 43 y.o. male referred by Dr. Durward Parcel, MD  for consultation & management of epigastric/right upper quadrant pain  Virtual Visit via Telephone Note  I connected with Manuel Jones on 10/27/18 at  1:30 PM EDT by video and verified that I am speaking with the correct person using two identifiers.   I discussed the limitations, risks, security and privacy concerns of performing an evaluation and management service by video and the availability of in person appointments. I also discussed with the patient that there may be a patient responsible charge related to this service. The patient expressed understanding and agreed to proceed.  Location of the Patient: Home  Location of the provider: Home office   History of Present Illness:  Manuel Jones has been seeing me for chronic epigastric/right upper quadrant pain.  He underwent EGD and colonoscopy.  EGD revealed esophageal ulcers and LA grade D esophagitis.  I started him on omeprazole 40 mg twice daily after the EGD which he has been compliant with.  He reports the epigastric pain has slightly improved, 4/10 in severity and sometimes the pain radiates to right upper quadrant as well as to the back.  Ultrasound right upper quadrant was unremarkable for cholelithiasis.  He denies any heartburn.  Sometimes he notices worsening of pain after eating.  He denies any change in his bowel habits, he denies bloating, weight loss or loss of appetite. He quit smoking about 4 months ago, he is not taking alcohol  Follow-up visit 10/27/2018  Patient reports that his epigastric pain has improved.  He underwent CT abdomen and pelvis which was unremarkable.  He had EGD which was unremarkable except for hiatal hernia.  Continues to take omeprazole 40 mg once a day  NSAIDs: None  Antiplts/Anticoagulants/Anti thrombotics: None  GI Procedures: EGD and colonoscopy 06/09/2018 - Normal duodenal bulb and second portion of the duodenum. - Medium-sized hiatal hernia. - Normal stomach. Biopsied. - Non-bleeding esophageal ulcers. - LA Grade D erosive esophagitis.    - The examined portion of the ileum was normal. - Diverticulosis in the sigmoid colon. - The entire examined colon is normal. - The distal rectum and anal verge are normal on retroflexion view. - No specimens collected.  EGD 09/26/2018 Small hiatal hernia Normal esophagus Normal stomach Normal duodenum  Past Medical History:  Diagnosis Date  . GERD (gastroesophageal reflux disease)   . Wears dentures    full upper    Past Surgical History:  Procedure Laterality Date  . APPENDECTOMY    . COLONOSCOPY WITH PROPOFOL N/A 06/09/2018   Procedure: COLONOSCOPY WITH PROPOFOL;  Surgeon: Lin Landsman, MD;  Location: [place];  Service: Endoscopy;  Laterality: N/A;  . ESOPHAGOGASTRODUODENOSCOPY (EGD) WITH PROPOFOL N/A 06/09/2018   Procedure: ESOPHAGOGASTRODUODENOSCOPY (EGD) WITH BIOPSIES;  Surgeon: Lin Landsman, MD;  Location: [place];  Service: Endoscopy;  Laterality: N/A;  . ESOPHAGOGASTRODUODENOSCOPY (EGD) WITH PROPOFOL N/A 09/29/2018   Procedure: ESOPHAGOGASTRODUODENOSCOPY (EGD) WITH PROPOFOL;  Surgeon: Lin Landsman, MD;  Location: [place];  Service:  Endoscopy;  Laterality: N/A;     Current Outpatient Medications:  .  fexofenadine-pseudoephedrine (ALLEGRA-D 24) 180-240 MG 24 hr tablet, Take 1 tablet by mouth daily., Disp: , Rfl:  .  fluticasone (FLONASE) 50 MCG/ACT nasal spray, USE 2 SPRAY(S) IN EACH NOSTRIL ONCE  DAILY FOR ALLERGIES, Disp: , Rfl:  .  amoxicillin (AMOXIL) 875 MG tablet, TAKE 1 TABLET BY MOUTH TWICE DAILY FOR INFECTION FOR 10 DAYS, Disp: , Rfl:  .  omeprazole (PRILOSEC) 40 MG capsule, Take 1 capsule (40 mg total) by mouth 2 (two) times daily before a meal., Disp: 180 capsule, Rfl: 1 .  triamcinolone ointment (KENALOG) 0.1 %, APPLY OINTMENT TOPICALLY TO AFFECTED AREA TWICE DAILY FOR UP TO 2 WEEKS, Disp: , Rfl:    Family History  Problem Relation Age of Onset  . Colon cancer Father      Social History   Tobacco Use  . Smoking status: Former Smoker    Packs/day: 0.50    Years: 18.00    Pack years: 9.00    Types: Cigarettes    Quit date: 04/13/2018    Years since quitting: 0.5  . Smokeless tobacco: Never Used  Substance Use Topics  . Alcohol use: Not Currently  . Drug use: Not Currently    Allergies as of 10/27/2018  . (No Known Allergies)     Imaging Studies: Reviewed  Assessment and Plan:   Manuel Jones is a 43 y.o. male with history of tobacco use, quit smoking 4 months ago, with no other past medical history is seen as a video visit for follow-up of chronic epigastric/right upper quadrant pain.  EGD revealed severe erosive esophagitis and esophageal ulcers.  Symptoms have modestly improved.  CT abdomen and pelvis pancreas protocol came back unremarkable.  EGD confirmed healing of the esophagitis and esophageal ulcers  Recommend to decrease omeprazole to 20 mg once a day for 2 to 3 weeks, then stop  Follow Up Instructions:   I discussed the assessment and treatment plan with the patient. The patient was provided an opportunity to ask questions and all were answered. The patient agreed with the plan and demonstrated an understanding of the instructions.   The patient was advised to call back or seek an in-person evaluation if the symptoms worsen or if the condition fails to improve as anticipated.  I provided 15 minutes of face-to-face time during this  encounter.   Follow up as needed   Cephas Darby, MD

## 2019-08-10 ENCOUNTER — Emergency Department
Admission: EM | Admit: 2019-08-10 | Discharge: 2019-08-10 | Disposition: A | Discharge status: Home / Self Care | Payer: Commercial Managed Care - PPO | Attending: Emergency Medicine | Admitting: Emergency Medicine

## 2019-08-10 ENCOUNTER — Other Ambulatory Visit: Payer: Self-pay

## 2019-08-10 ENCOUNTER — Encounter: Payer: Self-pay | Admitting: Emergency Medicine

## 2019-08-10 DIAGNOSIS — Z87891 Personal history of nicotine dependence: Secondary | ICD-10-CM | POA: Insufficient documentation

## 2019-08-10 DIAGNOSIS — R21 Rash and other nonspecific skin eruption: Secondary | ICD-10-CM | POA: Insufficient documentation

## 2019-08-10 MED ORDER — PREDNISONE 10 MG PO TABS: 10.0000 mg | ORAL_TABLET | Freq: Every day | ORAL | 0 refills | Status: DC

## 2019-08-10 MED ORDER — DEXAMETHASONE SODIUM PHOSPHATE 10 MG/ML IJ SOLN
10.0000 mg | Freq: Once | INTRAMUSCULAR | Status: AC
  Administered 2019-08-10: 10 mg via INTRAMUSCULAR
  Filled 2019-08-10: qty 1

## 2019-08-10 NOTE — ED Notes (Signed)
Rash all over face, arms, and legs that started this AM. No new meds or detergent.

## 2019-08-10 NOTE — ED Provider Notes (Signed)
[place] EMERGENCY DEPARTMENT Provider Note   CSN: [id number] Arrival date & time: 08/10/19  1450     History Chief Complaint  Patient presents with  . Rash    Manuel Jones is a 44 y.o. male presents to the emergency department for evaluation of a generalized rash throughout his entire body that started this morning.  Patient has been diagnosed with quarantine from Covid.  Last day of quarantine is today.  He denies any new medications soaps detergents.  States he is done very well with Covid, states very mild symptoms.  No fevers, chest pain, shortness of breath upper or lower extremity swelling.  He complains of a erythematous burning itching rash that started out this morning along the lower and upper extremities and has spread to his trunk and a little bit on his face.  He denies any fevers, headaches, vision changes, oral lesions.  He has not take any medications for the rash.  HPI     Past Medical History:  Diagnosis Date  . GERD (gastroesophageal reflux disease)   . Wears dentures    full upper    Patient Active Problem List   Diagnosis Date Noted  . Gastroesophageal reflux disease without esophagitis   . Abdominal pain, epigastric   . Erosive esophagitis   . Ulcer of esophagus without bleeding   . Family history of colon cancer     Past Surgical History:  Procedure Laterality Date  . APPENDECTOMY    . COLONOSCOPY WITH PROPOFOL N/A 06/09/2018   Procedure: COLONOSCOPY WITH PROPOFOL;  Surgeon: Lin Landsman, MD;  Location: [place];  Service: Endoscopy;  Laterality: N/A;  . ESOPHAGOGASTRODUODENOSCOPY (EGD) WITH PROPOFOL N/A 06/09/2018   Procedure: ESOPHAGOGASTRODUODENOSCOPY (EGD) WITH BIOPSIES;  Surgeon: Lin Landsman, MD;  Location: [place];  Service: Endoscopy;  Laterality: N/A;  . ESOPHAGOGASTRODUODENOSCOPY (EGD) WITH PROPOFOL N/A 09/29/2018   Procedure: ESOPHAGOGASTRODUODENOSCOPY (EGD) WITH PROPOFOL;   Surgeon: Lin Landsman, MD;  Location: [place];  Service: Endoscopy;  Laterality: N/A;       Family History  Problem Relation Age of Onset  . Colon cancer Father     Social History   Tobacco Use  . Smoking status: Former Smoker    Packs/day: 0.50    Years: 18.00    Pack years: 9.00    Types: Cigarettes    Quit date: 04/13/2018    Years since quitting: 1.3  . Smokeless tobacco: Never Used  Substance Use Topics  . Alcohol use: Not Currently  . Drug use: Not Currently    Home Medications Prior to Admission medications   Medication Sig Start Date End Date Taking? Authorizing Provider  amoxicillin (AMOXIL) 875 MG tablet TAKE 1 TABLET BY MOUTH TWICE DAILY FOR INFECTION FOR 10 DAYS 08/11/18   [provider]  fexofenadine-pseudoephedrine (ALLEGRA-D 24) 180-240 MG 24 hr tablet Take 1 tablet by mouth daily.    [provider]  fluticasone (FLONASE) 50 MCG/ACT nasal spray USE 2 SPRAY(S) IN EACH NOSTRIL ONCE DAILY FOR ALLERGIES 07/22/18   [provider]  omeprazole (PRILOSEC) 40 MG capsule Take 1 capsule (40 mg total) by mouth 2 (two) times daily before a meal. 06/09/18 09/23/18  Vanga, Tally Due, MD  triamcinolone ointment (KENALOG) 0.1 % APPLY OINTMENT TOPICALLY TO AFFECTED AREA TWICE DAILY FOR UP TO 2 WEEKS 08/21/18   [provider]    Allergies    Patient has no known allergies.  Review of Systems  Review of Systems  Constitutional: Negative for chills and fever.  HENT: Negative for facial swelling.   Respiratory: Negative for cough, chest tightness, shortness of breath and wheezing.   Musculoskeletal: Negative for neck pain.  Skin: Positive for rash. Negative for wound.  Neurological: Negative for dizziness and headaches.    Physical Exam Updated Vital Signs BP 125/88 (BP Location: Right Arm)   Pulse 88   Temp 98.1 F (36.7 C) (Oral)   Resp 18   Ht 6' (1.829 m)   Wt 100.2 kg   SpO2 97%   BMI 29.96 kg/m    Physical Exam Constitutional:      Appearance: He is well-developed.  HENT:     Head: Normocephalic and atraumatic.     Nose: No congestion or rhinorrhea.     Mouth/Throat:     Pharynx: No posterior oropharyngeal erythema.     Comments: No intraoral swelling.  No pharyngeal erythema.  No tonsillar exudates.  Uvula midline. Eyes:     Conjunctiva/sclera: Conjunctivae normal.  Cardiovascular:     Rate and Rhythm: Normal rate.  Pulmonary:     Effort: Pulmonary effort is normal. No respiratory distress.     Breath sounds: Normal breath sounds. No wheezing or rales.  Chest:     Chest wall: No tenderness.  Musculoskeletal:        General: Normal range of motion.     Cervical back: Normal range of motion.  Skin:    General: Skin is warm.     Findings: Rash present.     Comments: Positive for erythematous macular rash, generalized throughout the lower and upper extremities, face, trunk and back.  No solar palm involvement.  No intraoral rashes.  Neurological:     General: No focal deficit present.     Mental Status: He is alert and oriented to person, place, and time.     Cranial Nerves: No cranial nerve deficit.  Psychiatric:        Behavior: Behavior normal.        Thought Content: Thought content normal.     ED Results / Procedures / Treatments   Labs (all labs ordered are listed, but only abnormal results are displayed) Labs Reviewed - No data to display  EKG None  Radiology No results found.  Procedures Procedures (including critical care time)  Medications Ordered in ED Medications  dexamethasone (DECADRON) injection 10 mg (10 mg Intramuscular Given 08/10/19 1549)    ED Course  I have reviewed the triage vital signs and the nursing notes.  Pertinent labs & imaging results that were available during my care of the patient were reviewed by me and considered in my medical decision making (see chart for details).    MDM Rules/Calculators/A&P                       44 year old male who woke up with a rash earlier today.  No signs of anaphylaxis.  Vital signs are stable.  No facial swelling, edema or wheezing.  He is given 10 mg of dexamethasone IM which significantly improved rash.  Rash is at least 50% better.  He will go home and take Benadryl.  He is given a prednisone taper that he can start tomorrow if continued rash.  He understands signs and symptoms return to the ED for.  Rash consistent with urticaria, possible viral etiology from Covid.  No signs of bacterial infection. Final Clinical Impression(s) / ED Diagnoses Final diagnoses:  Rash  Rx / DC Orders ED Discharge Orders    None       Renata Caprice 08/10/19 1658    Duffy Bruce, MD 08/13/19 2031

## 2019-08-10 NOTE — Discharge Instructions (Addendum)
Please take Benadryl at home as needed.  You may apply topical cortisone cream or calamine lotion.  Take prednisone tomorrow if continuing to have rash.  Return to the ER for any worsening rash, facial swelling, difficulty breathing, wheezing, worsening symptoms or urgent changes in your health.

## 2019-08-10 NOTE — ED Triage Notes (Signed)
C/O rash to trunk, arms, and face today.  States woke up this morning with rash.  C/O itching to rash.  AAOx3.  No SOB/ No DOE.Marland Kitchen  NAD

## 2019-09-22 IMAGING — US US ABDOMEN COMPLETE
1 series · 14 of 25 positions shown · non-contrast
Comparison: None.

CLINICAL DATA: Upper abdominal pain, most severe postprandial

EXAM:
ABDOMEN ULTRASOUND COMPLETE

[Series 1: us abdomen complete · 14 of 107 slices shown]
[im 1/107]
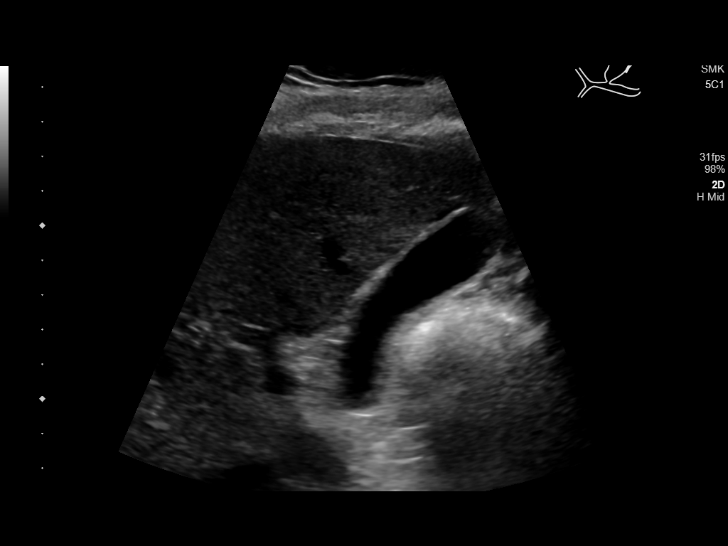
[im 9/107]
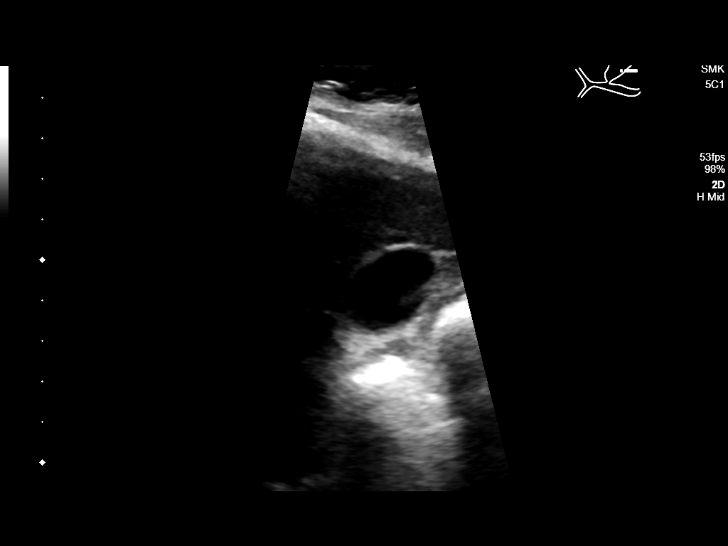
[im 18/107]
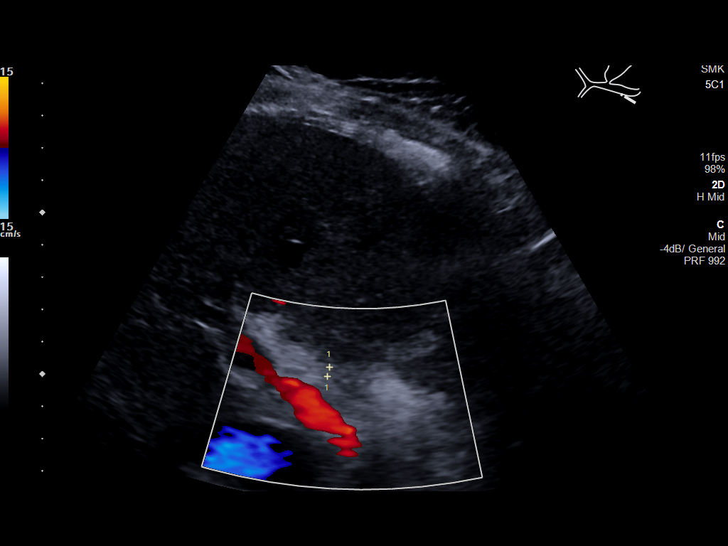
[im 27/107]
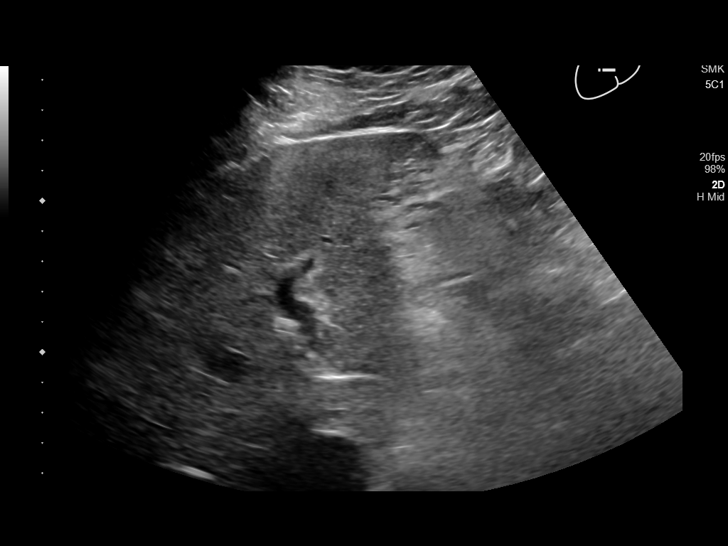
[im 36/107]
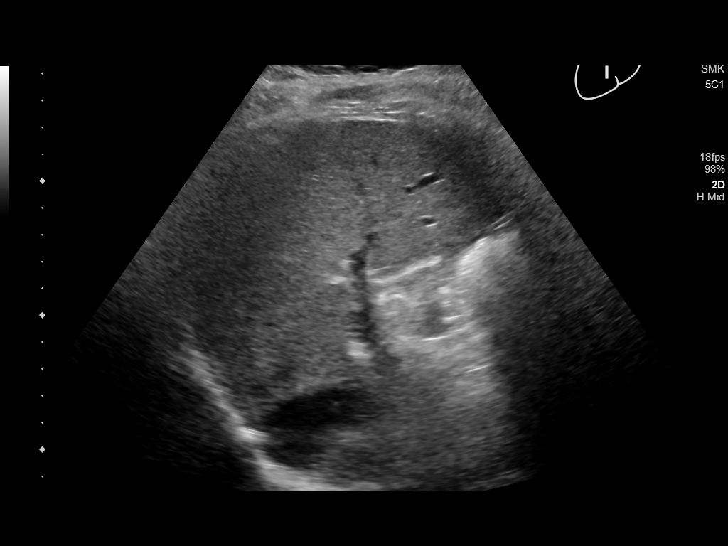
[im 40/107]
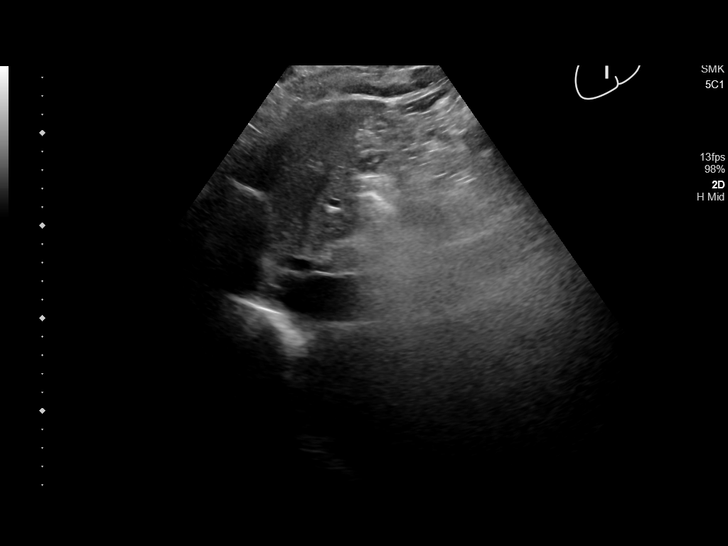
[im 49/107]
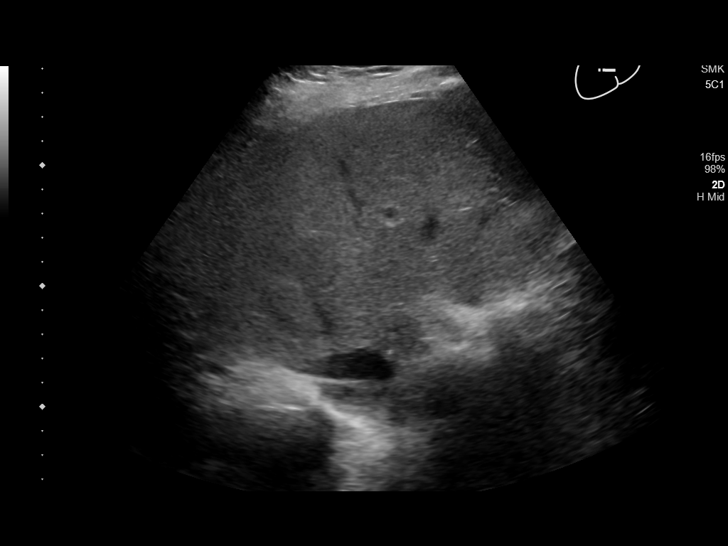
[im 58/107]
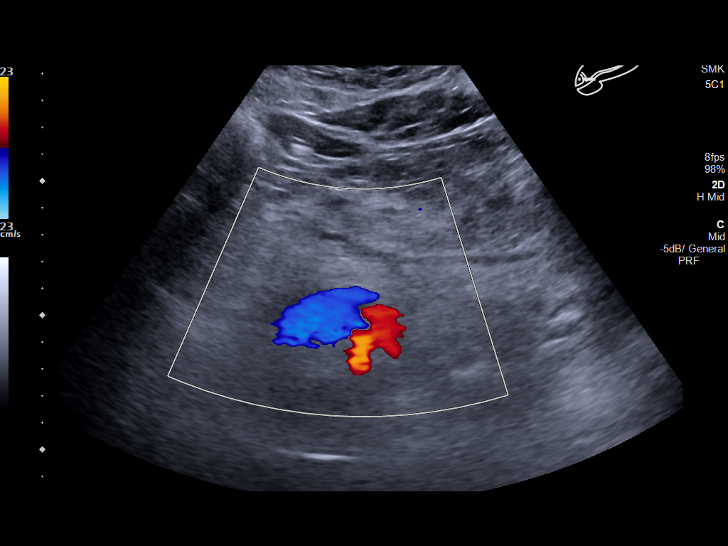
[im 67/107]
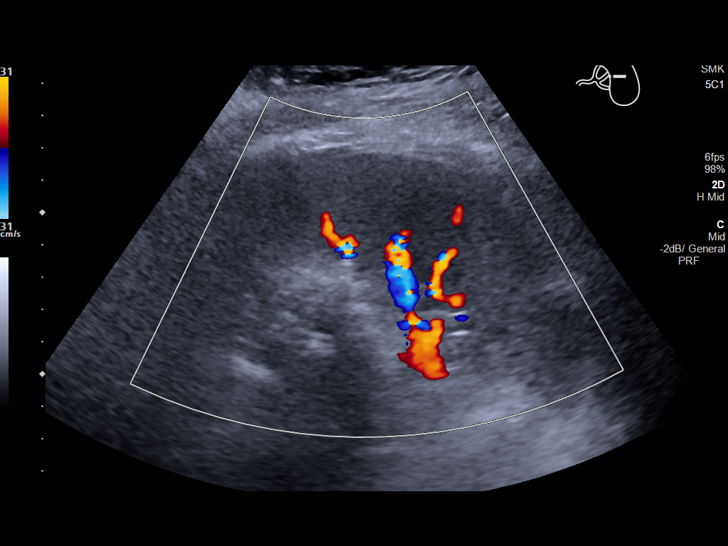
[im 71/107]
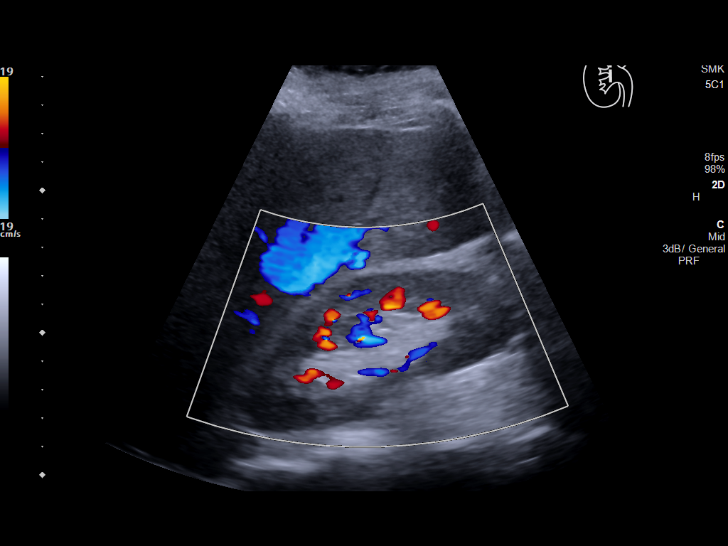
[im 80/107]
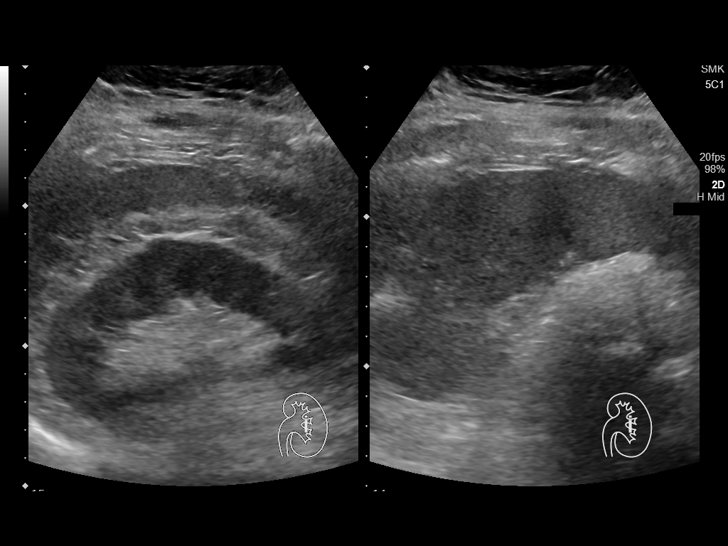
[im 89/107]
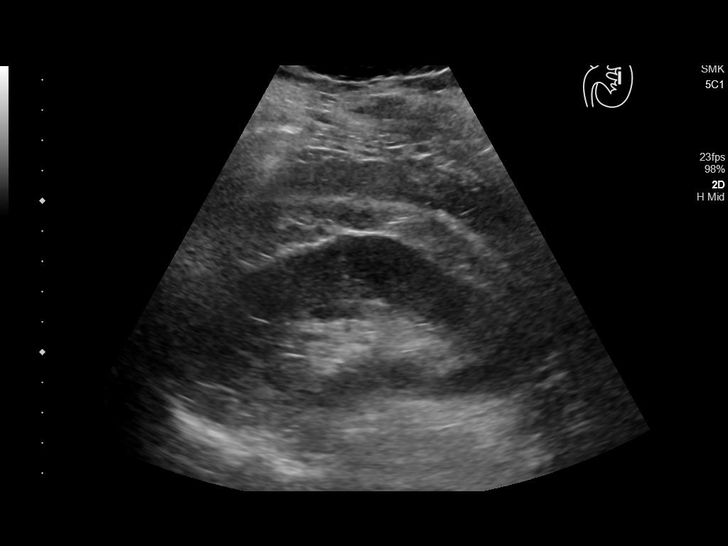
[im 98/107]
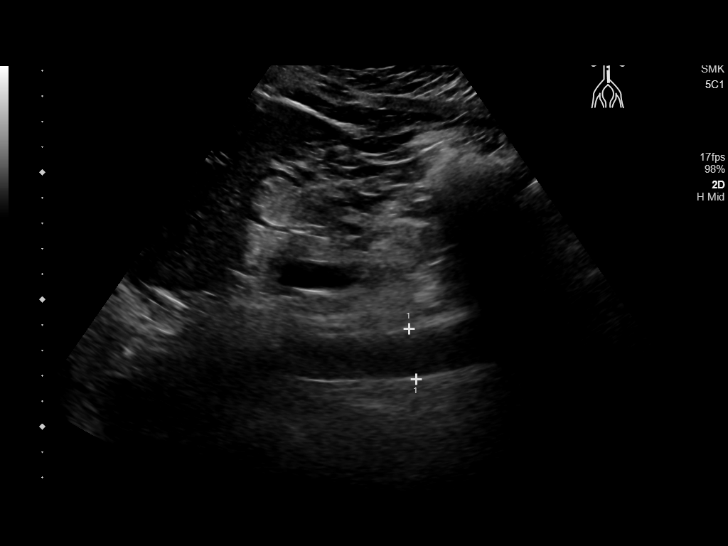
[im 107/107]
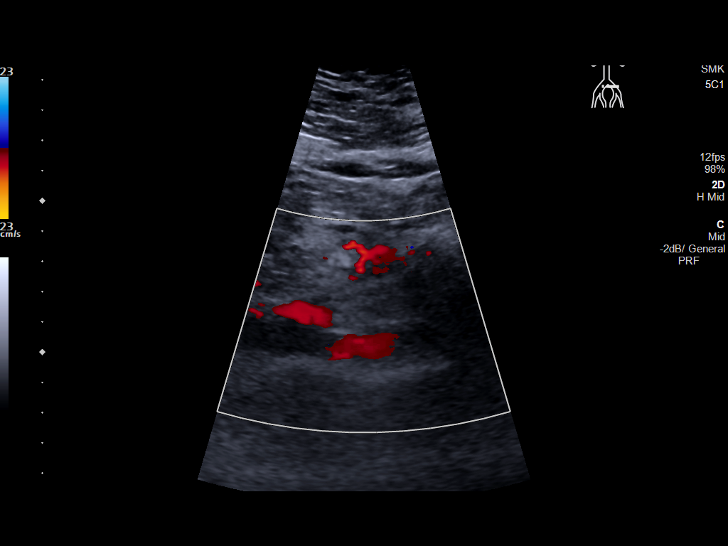

[14 of 25 positions shown; findings below may reference images not displayed]

FINDINGS: Gallbladder: No gallstones or wall thickening visualized. There is
no pericholecystic fluid. No sonographic Murphy sign noted by
sonographer.

Common bile duct: Diameter: 3 mm. No intrahepatic, common hepatic,
or common bile dilatation.

Liver: No focal lesion identified. Within normal limits in
parenchymal echogenicity. Portal vein is patent on color Doppler
imaging with normal direction of blood flow towards the liver.

IVC: No abnormality visualized.

Pancreas: Visualized portion unremarkable. Portions of pancreas
obscured by gas.

Spleen: Size and appearance within normal limits.

Right Kidney: Length: 11.2 cm. Echogenicity within normal limits. No
mass or hydronephrosis visualized.

Left Kidney: Length: 11.4 cm. Echogenicity within normal limits. No
mass or hydronephrosis visualized.

Abdominal aorta: No aneurysm visualized.

Other findings: No demonstrable ascites.
IMPRESSION: Portions of pancreas obscured by gas. Visualized portions of
pancreas appear normal.

Study otherwise unremarkable.

## 2020-03-13 IMAGING — CT CT ABDOMEN AND PELVIS WITH CONTRAST
2 of 5 series · 16 of 46 positions shown, 18 images · IV contrast (omnipaque)
Comparison: None.

CLINICAL DATA: Acute epigastric abdominal pain. He has also had
epigastric abdominal pain since February 2018. History of alcohol
use. Previous appendectomy and hernia repair. Ex-smoker.

EXAM:
CT ABDOMEN AND PELVIS WITH CONTRAST
TECHNIQUE: Multidetector CT imaging of the abdomen and pelvis was performed
using the standard protocol following bolus administration of
intravenous contrast.
CONTRAST:  100mL OMNIPAQUE IOHEXOL 300 MG/ML  SOLN

[Series 2: abd pelvis · axial · 0.76mm/px · z∈[-1561,-1136]mm · 13 of 97 slices shown, 15 images]
[im 6/97  soft-tissue]
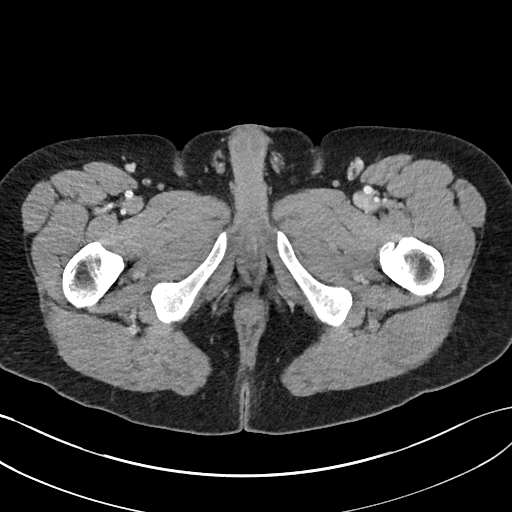
[im 6/97  bone]
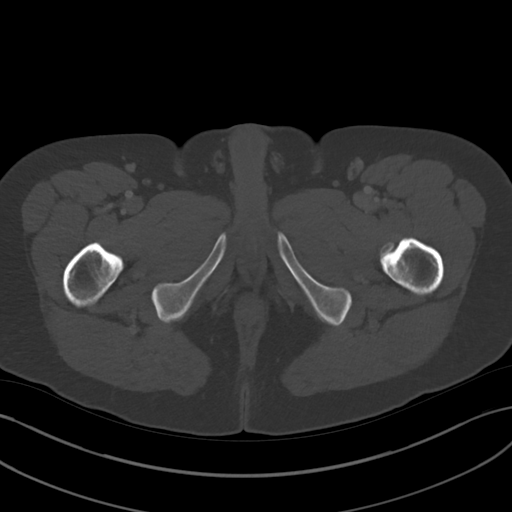
[im 16/97  soft-tissue]
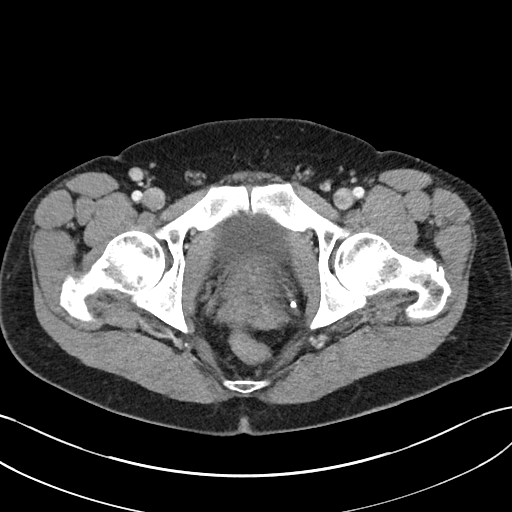
[im 21/97  soft-tissue]
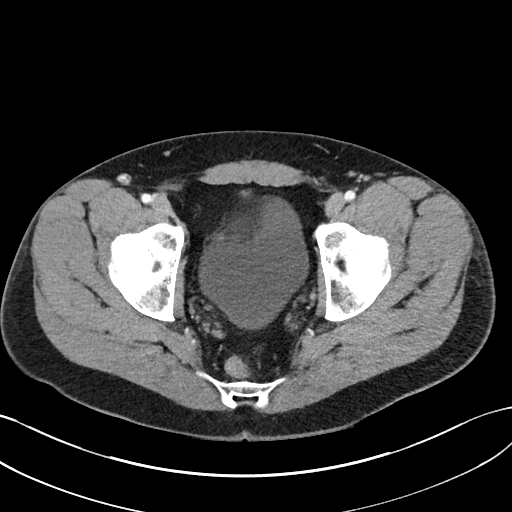
[im 26/97  soft-tissue]
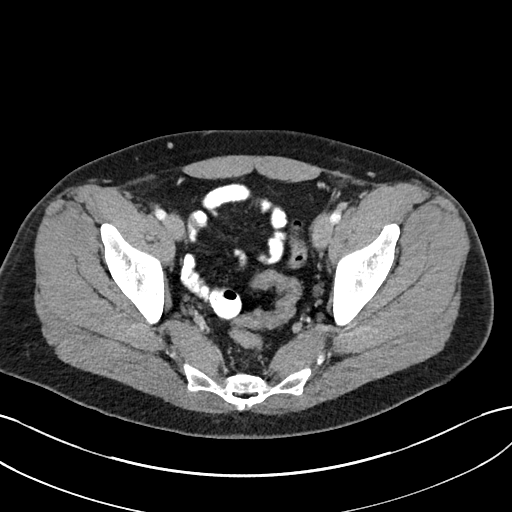
[im 36/97  soft-tissue]
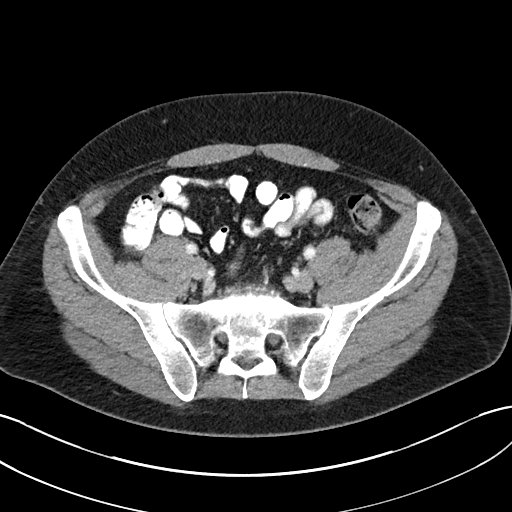
[im 41/97  soft-tissue]
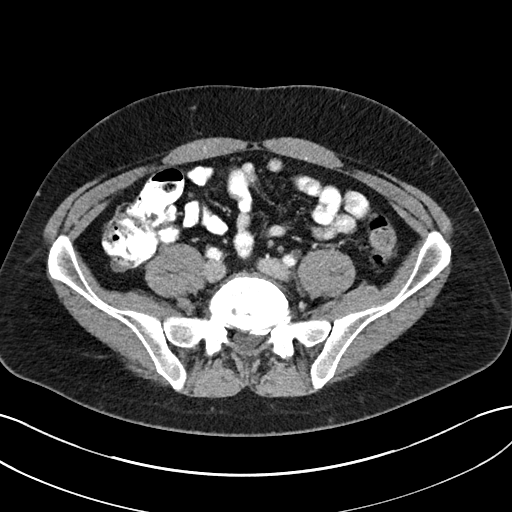
[im 51/97  soft-tissue]
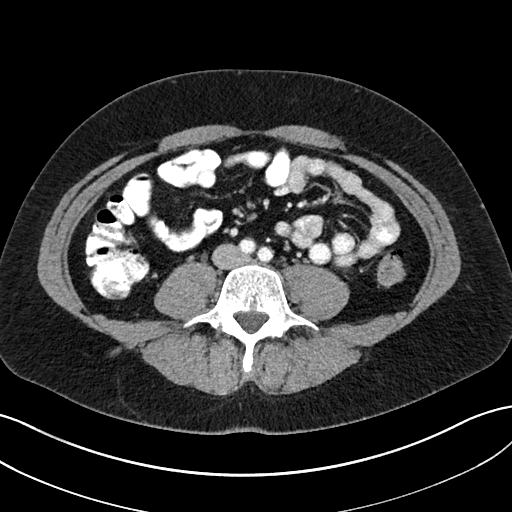
[im 56/97  soft-tissue]
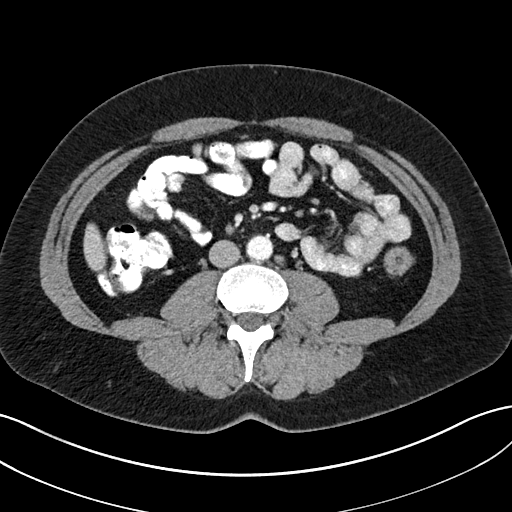
[im 61/97  soft-tissue]
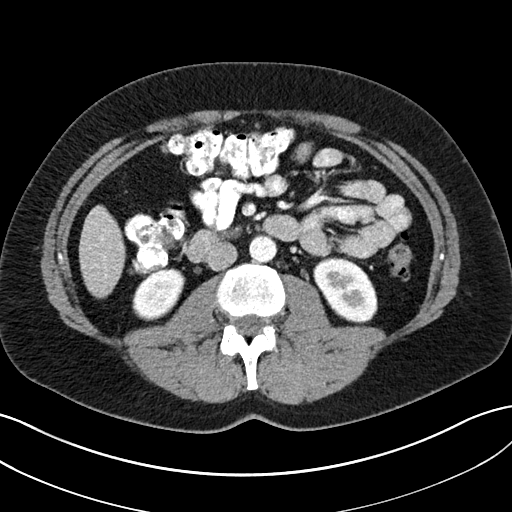
[im 61/97  bone]
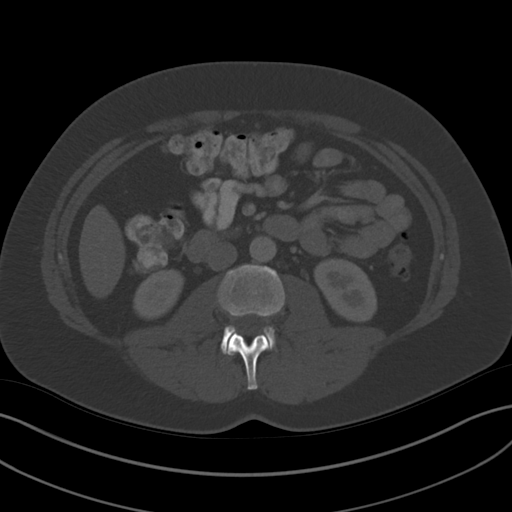
[im 71/97  soft-tissue]
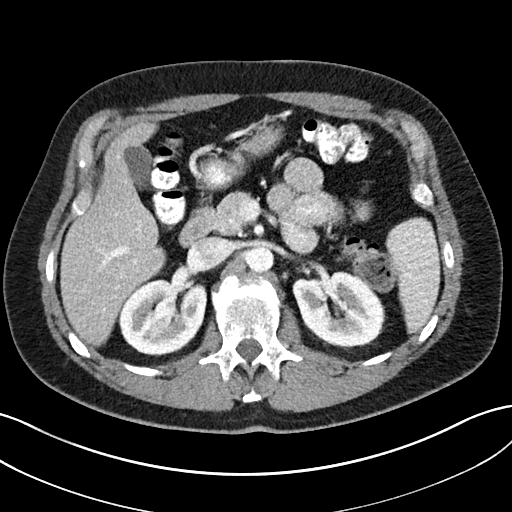
[im 76/97  soft-tissue]
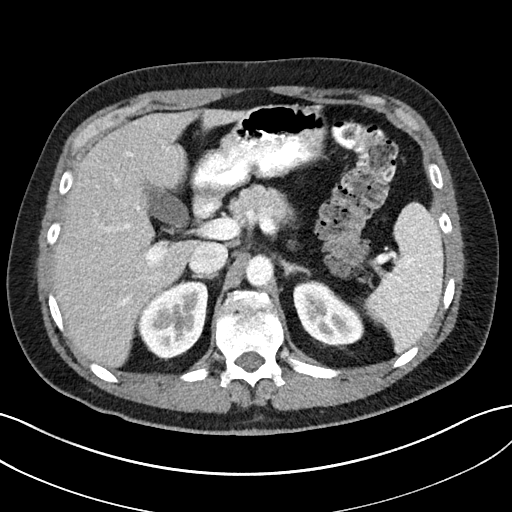
[im 81/97  soft-tissue]
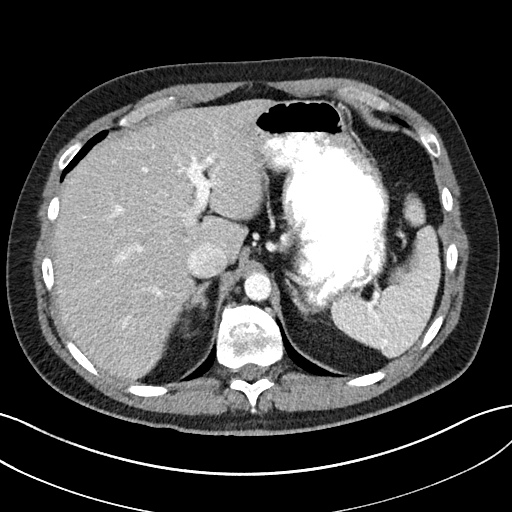
[im 91/97  soft-tissue]
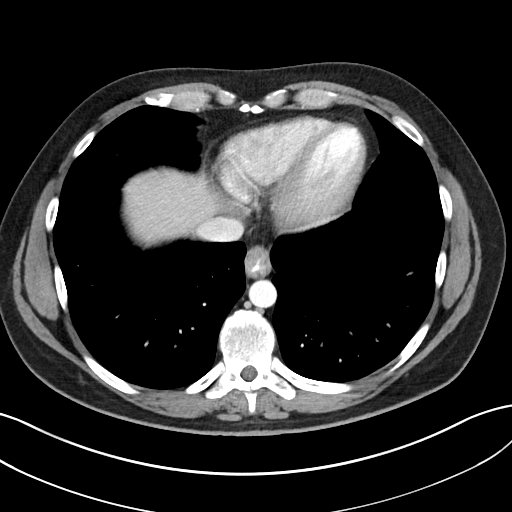

[Series 4: coronal abd pelvis · coronal · 0.76mm/px · 3 of 146 slices shown]
[im 49/146  soft-tissue]
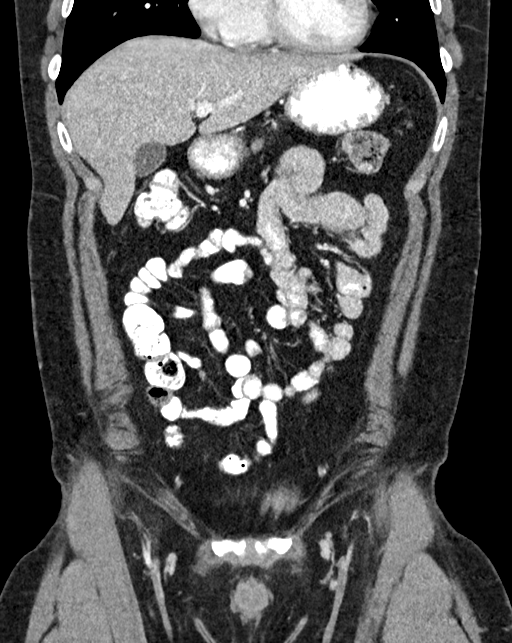
[im 65/146  soft-tissue]
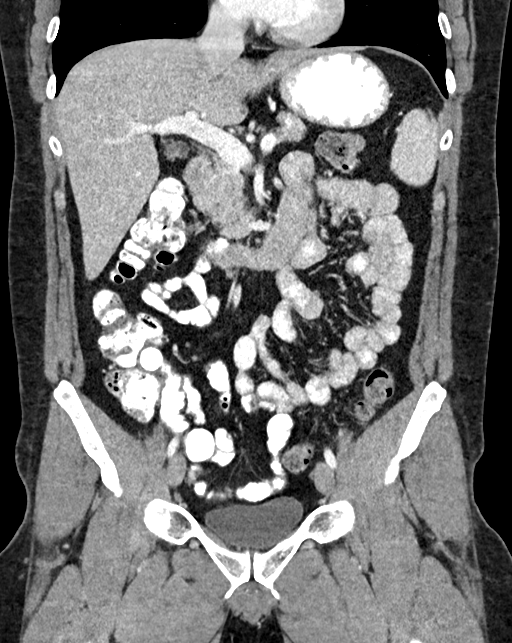
[im 81/146  soft-tissue]
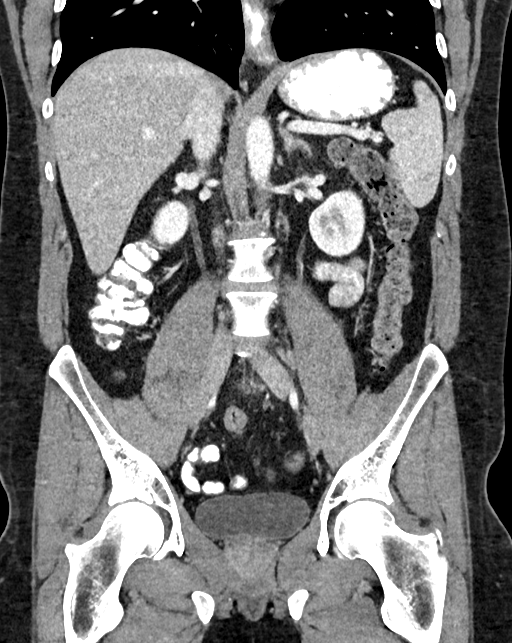

[16 of 46 positions shown; findings below may reference images not displayed]

FINDINGS: Lower chest: Unremarkable.

Hepatobiliary: No focal liver abnormality is seen. No gallstones,
gallbladder wall thickening, or biliary dilatation.

Pancreas: Unremarkable. No pancreatic ductal dilatation or
surrounding inflammatory changes.

Spleen: Normal in size without focal abnormality.

Adrenals/Urinary Tract: Adrenal glands are unremarkable. Kidneys are
normal, without renal calculi, focal lesion, or hydronephrosis.
Bladder is unremarkable.

Stomach/Bowel: Minimal colonic diverticulosis without evidence of
diverticulitis. Small hiatal hernia. Unremarkable small bowel.
Surgically absent appendix.

Vascular/Lymphatic: No significant vascular findings are present. No
enlarged abdominal or pelvic lymph nodes.

Reproductive: Mild-to-moderate enlargement of the prostate gland.

Other: No abdominal wall hernia or abnormality. No abdominopelvic
ascites.

Musculoskeletal: Lumbar and lower thoracic spine degenerative
changes.
IMPRESSION: 1. No acute abnormality.
2. Small hiatal hernia.
3. Minimal colonic diverticulosis.
4. Mild-to-moderate prostatic hypertrophy.

## 2020-10-02 ENCOUNTER — Emergency Department
Admission: EM | Admit: 2020-10-02 | Discharge: 2020-10-02 | Disposition: A | Discharge status: Home / Self Care | Payer: Commercial Managed Care - PPO | Attending: Emergency Medicine | Admitting: Emergency Medicine

## 2020-10-02 ENCOUNTER — Other Ambulatory Visit: Payer: Self-pay

## 2020-10-02 DIAGNOSIS — K219 Gastro-esophageal reflux disease without esophagitis: Secondary | ICD-10-CM | POA: Diagnosis not present

## 2020-10-02 DIAGNOSIS — Z87891 Personal history of nicotine dependence: Secondary | ICD-10-CM | POA: Insufficient documentation

## 2020-10-02 DIAGNOSIS — R1012 Left upper quadrant pain: Secondary | ICD-10-CM | POA: Diagnosis present

## 2020-10-02 LAB — CBC
HCT: 40.6 % (ref 39.0–52.0)
Hemoglobin: 14.3 g/dL (ref 13.0–17.0)
MCH: 31.4 pg (ref 26.0–34.0)
MCHC: 35.2 g/dL (ref 30.0–36.0)
MCV: 89 fL (ref 80.0–100.0)
Platelets: 231 10*3/uL (ref 150–400)
RBC: 4.56 MIL/uL (ref 4.22–5.81)
RDW: 12.6 % (ref 11.5–15.5)
WBC: 7.3 10*3/uL (ref 4.0–10.5)
nRBC: 0 % (ref 0.0–0.2)

## 2020-10-02 LAB — COMPREHENSIVE METABOLIC PANEL
ALT: 34 U/L (ref 0–44)
AST: 27 U/L (ref 15–41)
Albumin: 4.1 g/dL (ref 3.5–5.0)
Alkaline Phosphatase: 126 U/L (ref 38–126)
Anion gap: 6 (ref 5–15)
BUN: 14 mg/dL (ref 6–20)
CO2: 25 mmol/L (ref 22–32)
Calcium: 9.1 mg/dL (ref 8.9–10.3)
Chloride: 108 mmol/L (ref 98–111)
Creatinine, Ser: 0.79 mg/dL (ref 0.61–1.24)
GFR, Estimated: >60 mL/min (ref >60)
Glucose, Bld: 102 mg/dL — ABNORMAL HIGH (ref 70–99)
Potassium: 3.6 mmol/L (ref 3.5–5.1)
Sodium: 139 mmol/L (ref 135–145)
Total Bilirubin: 1.3 mg/dL — ABNORMAL HIGH (ref 0.3–1.2)
Total Protein: 7.7 g/dL (ref 6.5–8.1)

## 2020-10-02 LAB — URINALYSIS, COMPLETE (UACMP) WITH MICROSCOPIC
Bacteria, UA: NONE SEEN
Bilirubin Urine: NEGATIVE
Glucose, UA: NEGATIVE mg/dL
Ketones, ur: NEGATIVE mg/dL
Leukocytes,Ua: NEGATIVE
Nitrite: NEGATIVE
Protein, ur: NEGATIVE mg/dL
Specific Gravity, Urine: 1.021 (ref 1.005–1.030)
Squamous Epithelial / HPF: NONE SEEN (ref 0–5)
pH: 5 (ref 5.0–8.0)

## 2020-10-02 LAB — LIPASE, BLOOD: Lipase: 27 U/L (ref 11–51)

## 2020-10-02 NOTE — ED Triage Notes (Signed)
Pt to ED POV for LUQ pain for 2 weeks, states does not have appt with PCP for another few weeks. Reports nausea and diarrhea. Has not taken any meds Hx acid reflux  Pt placed in recliner for blood draw d/t hx of syncope with blood draw

## 2020-10-02 NOTE — ED Provider Notes (Signed)
[facility] Emergency Department Provider Note   ____________________________________________   Event Date/Time   First MD Initiated Contact with Patient 10/02/20 1707     (approximate)  I have reviewed the triage vital signs and the nursing notes.   HISTORY  Chief Complaint Abdominal Pain    HPI Manuel Jones is a 45 y.o. male with the below stated past medical history who presents for left upper quadrant abdominal pain that has been intermittent over the last 2 weeks.  Patient describes aching, 7/10, nonradiating left upper quadrant abdominal pain that is worse with p.o. intake as well as coughing and deep breaths.  Patient states that he has been trying to use his reflux medication as he states this pain is similar to pain that he has had on his right upper quadrant whenever he gets reflux.  Patient does endorse worsening over the last 2 weeks but denies any exacerbating or relieving factors other than food.  Endorses mild associated nausea.  Patient currently denies any vision changes, tinnitus, difficulty speaking, facial droop, sore throat, chest pain, shortness of breath, vomiting/diarrhea, dysuria, or weakness/numbness/paresthesias in any extremity         Past Medical History:  Diagnosis Date   GERD (gastroesophageal reflux disease)    Wears dentures    full upper    Patient Active Problem List   Diagnosis Date Noted   Gastroesophageal reflux disease without esophagitis    Abdominal pain, epigastric    Erosive esophagitis    Ulcer of esophagus without bleeding    Family history of colon cancer     Past Surgical History:  Procedure Laterality Date   APPENDECTOMY     COLONOSCOPY WITH PROPOFOL N/A 06/09/2018   Procedure: COLONOSCOPY WITH PROPOFOL;  Surgeon: Lin Landsman, MD;  Location: [facility];  Service: Endoscopy;  Laterality: N/A;   ESOPHAGOGASTRODUODENOSCOPY (EGD) WITH PROPOFOL N/A 06/09/2018   Procedure:  ESOPHAGOGASTRODUODENOSCOPY (EGD) WITH BIOPSIES;  Surgeon: Lin Landsman, MD;  Location: [facility];  Service: Endoscopy;  Laterality: N/A;   ESOPHAGOGASTRODUODENOSCOPY (EGD) WITH PROPOFOL N/A 09/29/2018   Procedure: ESOPHAGOGASTRODUODENOSCOPY (EGD) WITH PROPOFOL;  Surgeon: Lin Landsman, MD;  Location: [place];  Service: Endoscopy;  Laterality: N/A;    Prior to Admission medications   Medication Sig Start Date End Date Taking? Authorizing Provider  amoxicillin (AMOXIL) 875 MG tablet TAKE 1 TABLET BY MOUTH TWICE DAILY FOR INFECTION FOR 10 DAYS 08/11/18   [provider]  fexofenadine-pseudoephedrine (ALLEGRA-D 24) 180-240 MG 24 hr tablet Take 1 tablet by mouth daily.    [provider]  fluticasone (FLONASE) 50 MCG/ACT nasal spray USE 2 SPRAY(S) IN EACH NOSTRIL ONCE DAILY FOR ALLERGIES 07/22/18   [provider]  omeprazole (PRILOSEC) 40 MG capsule Take 1 capsule (40 mg total) by mouth 2 (two) times daily before a meal. 06/09/18 09/23/18  Vanga, Tally Due, MD  predniSONE (DELTASONE) 10 MG tablet Take 1 tablet (10 mg total) by mouth daily. 6,5,4,3,2,1 six day taper 08/10/19   Duanne Guess, PA-C  triamcinolone ointment (KENALOG) 0.1 % APPLY OINTMENT TOPICALLY TO AFFECTED AREA TWICE DAILY FOR UP TO 2 WEEKS 08/21/18   [provider]    Allergies Patient has no known allergies.  Family History  Problem Relation Age of Onset   Colon cancer Father     Social History Social History   Tobacco Use   Smoking status: Former    Packs/day: 0.50    Years: 18.00  Pack years: 9.00    Types: Cigarettes    Quit date: 04/13/2018    Years since quitting: 2.4   Smokeless tobacco: Never  Vaping Use   Vaping Use: Never used  Substance Use Topics   Alcohol use: Not Currently   Drug use: Not Currently    Review of Systems Constitutional: No fever/chills Eyes: No visual changes. ENT: No sore throat. Cardiovascular: Denies chest  pain. Respiratory: Denies shortness of breath. Gastrointestinal: Endorses abdominal pain.  Endorses nausea, no vomiting.  No diarrhea. Genitourinary: Negative for dysuria. Musculoskeletal: Negative for acute arthralgias Skin: Negative for rash. Neurological: Negative for headaches, weakness/numbness/paresthesias in any extremity Psychiatric: Negative for suicidal ideation/homicidal ideation   ____________________________________________   PHYSICAL EXAM:  VITAL SIGNS: ED Triage Vitals  Enc Vitals Group     BP 10/02/20 1540 139/79     Pulse Rate 10/02/20 1540 92     Resp 10/02/20 1540 18     Temp 10/02/20 1540 98.7 F (37.1 C)     Temp Source 10/02/20 1540 Oral     SpO2 10/02/20 1540 99 %     Weight 10/02/20 1539 254 lb (115.2 kg)     Height 10/02/20 1539 6' (1.829 m)     Head Circumference --      Peak Flow --      Pain Score 10/02/20 1539 7     Pain Loc --      Pain Edu? --      Excl. in [place]? --    Constitutional: Alert and oriented. Well appearing and in no acute distress. Eyes: Conjunctivae are normal. PERRL. Head: Atraumatic. Nose: No congestion/rhinnorhea. Mouth/Throat: Mucous membranes are moist. Neck: No stridor Cardiovascular: Grossly normal heart sounds.  Good peripheral circulation. Respiratory: Normal respiratory effort.  No retractions. Gastrointestinal: Soft and nontender. No distention. Musculoskeletal: No obvious deformities Neurologic:  Normal speech and language. No gross focal neurologic deficits are appreciated. Skin:  Skin is warm and dry. No rash noted. Psychiatric: Mood and affect are normal. Speech and behavior are normal.  ____________________________________________   LABS (all labs ordered are listed, but only abnormal results are displayed)  Labs Reviewed  COMPREHENSIVE METABOLIC PANEL - Abnormal; Notable for the following components:      Result Value   Glucose, Bld 102 (*)    Total Bilirubin 1.3 (*)    All other components within  normal limits  URINALYSIS, COMPLETE (UACMP) WITH MICROSCOPIC - Abnormal; Notable for the following components:   Color, Urine YELLOW (*)    APPearance CLEAR (*)    Hgb urine dipstick SMALL (*)    All other components within normal limits  LIPASE, BLOOD  CBC   ____________________________________________  EKG  ED ECG REPORT I, Naaman Plummer, the attending physician, personally viewed and interpreted this ECG.  Date: 10/02/2020 EKG Time: 1543 Rate: 84 Rhythm: normal sinus rhythm QRS Axis: normal Intervals: normal ST/T Wave abnormalities: normal Narrative Interpretation: no evidence of acute ischemia  PROCEDURES  Procedure(s) performed (including Critical Care):  .1-3 Lead EKG Interpretation  Date/Time: 10/02/2020 5:53 PM Performed by: Naaman Plummer, MD Authorized by: Naaman Plummer, MD     Interpretation: normal     ECG rate:  70   ECG rate assessment: normal     Rhythm: sinus rhythm     Ectopy: none     Conduction: normal     ____________________________________________   INITIAL IMPRESSION / ASSESSMENT AND PLAN / ED COURSE  As part of my medical decision making,  I reviewed the following data within the [place] notes reviewed and incorporated, Labs reviewed, EKG interpreted, Old chart reviewed, Radiograph reviewed and Notes from prior ED visits reviewed and incorporated        Patient presents for abdominal pain.  Differential diagnosis includes appendicitis, abdominal aortic aneurysm, surgical biliary disease, pancreatitis, SBO, mesenteric ischemia, serious intra-abdominal bacterial illness, genital torsion. Doubt atypical ACS. Based on history, physical exam, radiologic/laboratory evaluation, there is no red flag results or symptomatology requiring emergent intervention or need for admission at this time.  Given patient's constellation of symptoms as well as worsening food and described similarity to previous reflux pain, concern  for GERD related issue including possibility of stomach acid pneumonitis.  Patient advised to sleep on a slight incline as well as use his omeprazole nightly instead of before dinner.  Patient expresses understanding and agrees with plan for discharge with PCP follow-up. Pt tolerating PO. Disposition: Patient will be discharged with strict return precautions and follow up with primary MD within 12-24 hours for further evaluation. Patient understands that this still may have an early presentation of an emergent medical condition such as appendicitis that will require a recheck.      ____________________________________________   FINAL CLINICAL IMPRESSION(S) / ED DIAGNOSES  Final diagnoses:  Left upper quadrant abdominal pain  Gastroesophageal reflux disease without esophagitis     ED Discharge Orders     None        Note:  This document was prepared using Dragon voice recognition software and may include unintentional dictation errors.    Naaman Plummer, MD 10/02/20 1754

## 2020-10-31 ENCOUNTER — Other Ambulatory Visit: Payer: Self-pay

## 2020-10-31 ENCOUNTER — Ambulatory Visit: Payer: Commercial Managed Care - PPO | Admitting: Gastroenterology

## 2020-10-31 ENCOUNTER — Encounter: Payer: Self-pay | Admitting: Gastroenterology

## 2020-10-31 VITALS — BP 131/72 | HR 71 | Temp 98.3°F | Ht 72.0 in | Wt 253.1 lb

## 2020-10-31 DIAGNOSIS — R1012 Left upper quadrant pain: Secondary | ICD-10-CM | POA: Diagnosis not present

## 2020-10-31 DIAGNOSIS — K219 Gastro-esophageal reflux disease without esophagitis: Secondary | ICD-10-CM

## 2020-10-31 DIAGNOSIS — K449 Diaphragmatic hernia without obstruction or gangrene: Secondary | ICD-10-CM | POA: Diagnosis not present

## 2020-10-31 MED ORDER — OMEPRAZOLE 40 MG PO CPDR: 40.0000 mg | DELAYED_RELEASE_CAPSULE | Freq: Every day | ORAL | 2 refills | Status: DC

## 2020-10-31 MED ORDER — OMEPRAZOLE 40 MG PO CPDR: 40.0000 mg | DELAYED_RELEASE_CAPSULE | Freq: Two times a day (BID) | ORAL | 3 refills | Status: DC

## 2020-10-31 NOTE — Progress Notes (Signed)
Cephas Darby, MD [address]  [place]  [place],  [address]  Main: [phone]  Fax: [phone]    Gastroenterology Consultation  Referring Provider:     Durward Parcel, MD Primary Care Physician:  Durward Parcel, MD Primary Gastroenterologist:  Dr. Cephas Darby Reason for Consultation:     Epigastric/left upper quadrant pain        HPI:   Manuel Jones is a 45 y.o. Caucasian male referred by Dr. Durward Parcel, MD  for consultation & management of 3 months history of epigastric/right upper quadrant pain, mostly burning, and sometimes sharp, postprandial, worse after eating, sometimes radiating to right lower back.  He underwent ultrasound abdomen which was unremarkable, LFTs and CBC normal.  He is started on omeprazole 20 mg once a day.  Patient went to ER in early December secondary to chest pain radiating to left arm and EKG was negative, troponins normal.  He was started on H2 blocker and sucralfate.  Currently, he is only taking PPI.  He started taking aspirin 325 mg daily since ER visit on his own.  He also reports lower abdominal discomfort, soft brown stools.  He denies any other GI symptoms.  He quit smoking about 3 months ago after his ER visit.  He used to be heavy smoker.  He is a widower, wife died 47 years ago, lives with his mom.  He does machine work.  He denies history of depression, anxiety or stress.  Follow-up visit 10/27/2018 Patient reports that his epigastric pain has improved.  He underwent CT abdomen and pelvis which was unremarkable.  He had EGD which was unremarkable except for hiatal hernia.  Continues to take omeprazole 40 mg once a day  Follow-up visit 10/31/2020 Patient is here for follow-up of 3 weeks history of left upper quadrant pain associated with severe heartburn.  Patient reports that since January, he started drinking [facility] several times daily and he gained at least 20 pounds.  He was taking omeprazole 40 mg daily and he  recently restarted it after ER visit.  His labs including lipase, CMP, CBC space and urine analysis were unremarkable. He denies any other GI symptoms.  He had history of erosive esophagitis, confirmed healing  NSAIDs: None  Antiplts/Anticoagulants/Anti thrombotics: None  GI Procedures:  EGD and colonoscopy 06/09/2018 - Normal duodenal bulb and second portion of the duodenum. - Medium-sized hiatal hernia. - Normal stomach. Biopsied. - Non-bleeding esophageal ulcers. - LA Grade D erosive esophagitis.      - The examined portion of the ileum was normal. - Diverticulosis in the sigmoid colon. - The entire examined colon is normal. - The distal rectum and anal verge are normal on retroflexion view. - No specimens collected.   EGD 09/26/2018 Small hiatal hernia Normal esophagus Normal stomach Normal duodenum  His father passed away from colon cancer in his 71s  Past Medical History:  Diagnosis Date   GERD (gastroesophageal reflux disease)    Wears dentures    full upper    Past Surgical History:  Procedure Laterality Date   APPENDECTOMY     COLONOSCOPY WITH PROPOFOL N/A 06/09/2018   Procedure: COLONOSCOPY WITH PROPOFOL;  Surgeon: Lin Landsman, MD;  Location: [facility];  Service: Endoscopy;  Laterality: N/A;   ESOPHAGOGASTRODUODENOSCOPY (EGD) WITH PROPOFOL N/A 06/09/2018   Procedure: ESOPHAGOGASTRODUODENOSCOPY (EGD) WITH BIOPSIES;  Surgeon: Lin Landsman, MD;  Location: [facility];  Service: Endoscopy;  Laterality: N/A;   ESOPHAGOGASTRODUODENOSCOPY (  EGD) WITH PROPOFOL N/A 09/29/2018   Procedure: ESOPHAGOGASTRODUODENOSCOPY (EGD) WITH PROPOFOL;  Surgeon: Lin Landsman, MD;  Location: [facility];  Service: Endoscopy;  Laterality: N/A;    Current Outpatient Medications:    fexofenadine-pseudoephedrine (ALLEGRA-D 24) 180-240 MG 24 hr tablet, Take 1 tablet by mouth daily., Disp: , Rfl:    fluticasone (FLONASE) 50 MCG/ACT nasal spray, USE  2 SPRAY(S) IN EACH NOSTRIL ONCE DAILY FOR ALLERGIES, Disp: , Rfl:    omeprazole (PRILOSEC) 40 MG capsule, Take 1 capsule (40 mg total) by mouth daily., Disp: 30 capsule, Rfl: 2   triamcinolone ointment (KENALOG) 0.1 %, APPLY OINTMENT TOPICALLY TO AFFECTED AREA TWICE DAILY FOR UP TO 2 WEEKS, Disp: , Rfl:    Family History  Problem Relation Age of Onset   Colon cancer Father      Social History   Tobacco Use   Smoking status: Former    Packs/day: 0.50    Years: 18.00    Pack years: 9.00    Types: Cigarettes    Quit date: 04/13/2018    Years since quitting: 2.5   Smokeless tobacco: Never  Vaping Use   Vaping Use: Never used  Substance Use Topics   Alcohol use: Not Currently   Drug use: Not Currently    Allergies as of 10/31/2020   (No Known Allergies)    Review of Systems:    All systems reviewed and negative except where noted in HPI.   Physical Exam:  BP 131/72 (BP Location: Left Arm, Patient Position: Sitting, Cuff Size: Normal)   Pulse 71   Temp 98.3 F (36.8 C) (Oral)   Ht 6' (1.829 m)   Wt 253 lb 2 oz (114.8 kg)   BMI 34.33 kg/m  No LMP for male patient.  General:   Alert,  Well-developed, well-nourished, pleasant and cooperative in NAD Head:  Normocephalic and atraumatic. Eyes:  Sclera clear, no icterus.   Conjunctiva pink. Ears:  Normal auditory acuity. Nose:  No deformity, discharge, or lesions. Mouth:  No deformity or lesions,oropharynx pink & moist. Neck:  Supple; no masses or thyromegaly. Lungs:  Respirations even and unlabored.  Clear throughout to auscultation.   No wheezes, crackles, or rhonchi. No acute distress. Heart:  Regular rate and rhythm; no murmurs, clicks, rubs, or gallops. Abdomen:  Normal bowel sounds. Soft, nontender, non-distended without masses, hepatosplenomegaly or hernias noted.  No guarding or rebound tenderness.   Rectal: Not performed Msk:  Symmetrical without gross deformities. Good, equal movement & strength  bilaterally. Pulses:  Normal pulses noted. Extremities:  No clubbing or edema.  No cyanosis. Neurologic:  Alert and oriented x3;  grossly normal neurologically. Skin:  Intact without significant lesions or rashes. No jaundice. Psych:  Alert and cooperative. Normal mood and affect.  Imaging Studies: Reviewed  Assessment and Plan:   Manuel Jones is a 45 y.o. male with history of tobacco use, history of erosive esophagitis, confirmed healing, chronic GERD is seen for follow-up of 3 weeks history of flareup of GERD and left upper quadrant discomfort.  Labs including CBC, CMP, serum lipase were unremarkable.  Patient gained 20 pounds within last 6 months as he started drinking [facility] daily  Likely flareup of acid reflux Recommend increase omeprazole to 40 mg p.o. twice daily before meals and Advised to stop carbonated beverages Incorporate exercise and healthy diet If his symptoms are persistent after 1 month of high-dose PPI, recommend EGD    Follow up in 2 months   Manuel Jones  Smitty Pluck, MD

## 2020-12-03 ENCOUNTER — Other Ambulatory Visit: Payer: Self-pay | Admitting: Family Medicine

## 2020-12-03 DIAGNOSIS — M545 Low back pain, unspecified: Secondary | ICD-10-CM

## 2020-12-20 ENCOUNTER — Ambulatory Visit
Admission: RE | Admit: 2020-12-20 | Discharge: 2020-12-20 | Disposition: A | Discharge status: Home / Self Care | Payer: Commercial Managed Care - PPO | Source: Ambulatory Visit | Attending: Family Medicine | Admitting: Family Medicine

## 2020-12-20 ENCOUNTER — Other Ambulatory Visit: Payer: Self-pay

## 2020-12-20 DIAGNOSIS — M545 Low back pain, unspecified: Secondary | ICD-10-CM | POA: Insufficient documentation

## 2021-07-24 ENCOUNTER — Other Ambulatory Visit: Payer: Self-pay | Admitting: Family Medicine

## 2021-07-24 DIAGNOSIS — R1011 Right upper quadrant pain: Secondary | ICD-10-CM

## 2021-07-30 ENCOUNTER — Ambulatory Visit
Admission: RE | Admit: 2021-07-30 | Discharge: 2021-07-30 | Disposition: A | Discharge status: Home / Self Care | Payer: Commercial Managed Care - PPO | Source: Ambulatory Visit | Attending: Family Medicine | Admitting: Family Medicine

## 2021-07-30 DIAGNOSIS — R1011 Right upper quadrant pain: Secondary | ICD-10-CM | POA: Insufficient documentation

## 2022-01-15 ENCOUNTER — Ambulatory Visit: Payer: BLUE CROSS/BLUE SHIELD | Admitting: Dermatology

## 2022-01-15 DIAGNOSIS — L820 Inflamed seborrheic keratosis: Secondary | ICD-10-CM | POA: Diagnosis not present

## 2022-01-15 DIAGNOSIS — D492 Neoplasm of unspecified behavior of bone, soft tissue, and skin: Secondary | ICD-10-CM

## 2022-01-15 DIAGNOSIS — L821 Other seborrheic keratosis: Secondary | ICD-10-CM | POA: Diagnosis not present

## 2022-01-15 DIAGNOSIS — D225 Melanocytic nevi of trunk: Secondary | ICD-10-CM | POA: Diagnosis not present

## 2022-01-15 DIAGNOSIS — L578 Other skin changes due to chronic exposure to nonionizing radiation: Secondary | ICD-10-CM | POA: Diagnosis not present

## 2022-01-15 DIAGNOSIS — D224 Melanocytic nevi of scalp and neck: Secondary | ICD-10-CM

## 2022-01-15 NOTE — Patient Instructions (Addendum)
Cryotherapy Aftercare  Wash gently with soap and water everyday.   Apply Vaseline and Band-Aid daily until healed.   Wound Care Instructions  Cleanse wound gently with soap and water once a day then pat dry with clean gauze. Apply a thin coat of Petrolatum (petroleum jelly, "Vaseline") over the wound (unless you have an allergy to this). We recommend that you use a new, sterile tube of Vaseline. Do not pick or remove scabs. Do not remove the yellow or white "healing tissue" from the base of the wound.  Cover the wound with fresh, clean, nonstick gauze and secure with paper tape. You may use Band-Aids in place of gauze and tape if the wound is small enough, but would recommend trimming much of the tape off as there is often too much. Sometimes Band-Aids can irritate the skin.  You should call the office for your biopsy report after 1 week if you have not already been contacted.  If you experience any problems, such as abnormal amounts of bleeding, swelling, significant bruising, significant pain, or evidence of infection, please call the office immediately.  FOR ADULT SURGERY PATIENTS: If you need something for pain relief you may take 1 extra strength Tylenol (acetaminophen) AND 2 Ibuprofen (200mg each) together every 4 hours as needed for pain. (do not take these if you are allergic to them or if you have a reason you should not take them.) Typically, you may only need pain medication for 1 to 3 days.     Seborrheic Keratosis  What causes seborrheic keratoses? Seborrheic keratoses are harmless, common skin growths that first appear during adult life.  As time goes by, more growths appear.  Some people may develop a large number of them.  Seborrheic keratoses appear on both covered and uncovered body parts.  They are not caused by sunlight.  The tendency to develop seborrheic keratoses can be inherited.  They vary in color from skin-colored to gray, brown, or even black.  They can be either  smooth or have a rough, warty surface.   Seborrheic keratoses are superficial and look as if they were stuck on the skin.  Under the microscope this type of keratosis looks like layers upon layers of skin.  That is why at times the top layer may seem to fall off, but the rest of the growth remains and re-grows.    Treatment Seborrheic keratoses do not need to be treated, but can easily be removed in the office.  Seborrheic keratoses often cause symptoms when they rub on clothing or jewelry.  Lesions can be in the way of shaving.  If they become inflamed, they can cause itching, soreness, or burning.  Removal of a seborrheic keratosis can be accomplished by freezing, burning, or surgery. If any spot bleeds, scabs, or grows rapidly, please return to have it checked, as these can be an indication of a skin cancer.     Due to recent changes in healthcare laws, you may see results of your pathology and/or laboratory studies on MyChart before the doctors have had a chance to review them. We understand that in some cases there may be results that are confusing or concerning to you. Please understand that not all results are received at the same time and often the doctors may need to interpret multiple results in order to provide you with the best plan of care or course of treatment. Therefore, we ask that you please give us 2 business days to thoroughly review all your   results before contacting the office for clarification. Should we see a critical lab result, you will be contacted sooner.   If You Need Anything After Your Visit  If you have any questions or concerns for your doctor, please call our main line at [phone] and press option 4 to reach your doctor's medical assistant. If no one answers, please leave a voicemail as directed and we will return your call as soon as possible. Messages left after 4 pm will be answered the following business day.   You may also send us a message via MyChart. We  typically respond to MyChart messages within 1-2 business days.  For prescription refills, please ask your pharmacy to contact our office. Our fax number is [phone].  If you have an urgent issue when the clinic is closed that cannot wait until the next business day, you can page your doctor at the number below.    Please note that while we do our best to be available for urgent issues outside of office hours, we are not available 24/7.   If you have an urgent issue and are unable to reach us, you may choose to seek medical care at your doctor's office, retail clinic, urgent care center, or emergency room.  If you have a medical emergency, please immediately call 911 or go to the emergency department.  Pager Numbers  - Dr. Kowalski: [phone]  - Dr. Moye: [phone]  - Dr. Chaudhari: [phone]  In the event of inclement weather, please call our main line at [phone] for an update on the status of any delays or closures.  Dermatology Medication Tips: Please keep the boxes that topical medications come in in order to help keep track of the instructions about where and how to use these. Pharmacies typically print the medication instructions only on the boxes and not directly on the medication tubes.   If your medication is too expensive, please contact our office at [phone] option 4 or send us a message through MyChart.   We are unable to tell what your co-pay for medications will be in advance as this is different depending on your insurance coverage. However, we may be able to find a substitute medication at lower cost or fill out paperwork to get insurance to cover a needed medication.   If a prior authorization is required to get your medication covered by your insurance company, please allow us 1-2 business days to complete this process.  Drug prices often vary depending on where the prescription is filled and some pharmacies may offer cheaper prices.  The  website www.goodrx.com contains coupons for medications through different pharmacies. The prices here do not account for what the cost may be with help from insurance (it may be cheaper with your insurance), but the website can give you the price if you did not use any insurance.  - You can print the associated coupon and take it with your prescription to the pharmacy.  - You may also stop by our office during regular business hours and pick up a GoodRx coupon card.  - If you need your prescription sent electronically to a different pharmacy, notify our office through [facility] MyChart or by phone at [phone] option 4.     Si Usted Necesita Algo Despus de Su Visita  Tambin puede enviarnos un mensaje a travs de MyChart. Por lo general respondemos a los mensajes de MyChart en el transcurso de 1 a 2 das hbiles.  Para renovar recetas, por favor   pida a su farmacia que se ponga en contacto con nuestra oficina. Nuestro nmero de fax es el [phone].  Si tiene un asunto urgente cuando la clnica est cerrada y que no puede esperar hasta el siguiente da hbil, puede llamar/localizar a su doctor(a) al nmero que aparece a continuacin.   Por favor, tenga en cuenta que aunque hacemos todo lo posible para estar disponibles para asuntos urgentes fuera del horario de oficina, no estamos disponibles las 24 horas del da, los 7 das de la semana.   Si tiene un problema urgente y no puede comunicarse con nosotros, puede optar por buscar atencin mdica  en el consultorio de su doctor(a), en una clnica privada, en un centro de atencin urgente o en una sala de emergencias.  Si tiene una emergencia mdica, por favor llame inmediatamente al 911 o vaya a la sala de emergencias.  Nmeros de bper  - Dr. Kowalski: [phone]  - Dra. Moye: [phone]  - Dra. Kuyper: [phone]  En caso de inclemencias del tiempo, por favor llame a nuestra lnea principal al [phone] para una  actualizacin sobre el estado de cualquier retraso o cierre.  Consejos para la medicacin en dermatologa: Por favor, guarde las cajas en las que vienen los medicamentos de uso tpico para ayudarle a seguir las instrucciones sobre dnde y cmo usarlos. Las farmacias generalmente imprimen las instrucciones del medicamento slo en las cajas y no directamente en los tubos del medicamento.   Si su medicamento es muy caro, por favor, pngase en contacto con nuestra oficina llamando al [phone] y presione la opcin 4 o envenos un mensaje a travs de MyChart.   No podemos decirle cul ser su copago por los medicamentos por adelantado ya que esto es diferente dependiendo de la cobertura de su seguro. Sin embargo, es posible que podamos encontrar un medicamento sustituto a menor costo o llenar un formulario para que el seguro cubra el medicamento que se considera necesario.   Si se requiere una autorizacin previa para que su compaa de seguros cubra su medicamento, por favor permtanos de 1 a 2 das hbiles para completar este proceso.  Los precios de los medicamentos varan con frecuencia dependiendo del lugar de dnde se surte la receta y alguna farmacias pueden ofrecer precios ms baratos.  El sitio web www.goodrx.com tiene cupones para medicamentos de diferentes farmacias. Los precios aqu no tienen en cuenta lo que podra costar con la ayuda del seguro (puede ser ms barato con su seguro), pero el sitio web puede darle el precio si no utiliz ningn seguro.  - Puede imprimir el cupn correspondiente y llevarlo con su receta a la farmacia.  - Tambin puede pasar por nuestra oficina durante el horario de atencin regular y recoger una tarjeta de cupones de GoodRx.  - Si necesita que su receta se enve electrnicamente a una farmacia diferente, informe a nuestra oficina a travs de MyChart de [facility] o por telfono llamando al [phone] y presione la opcin 4.  

## 2022-01-15 NOTE — Progress Notes (Signed)
New Patient Visit  Subjective  Manuel Jones is a 46 y.o. male who presents for the following: Nevus (L cheek, L postauricular, R clavical, yrs, one on face irritated by shaving, one on L postauricular irritated with haircuts, no hx of skin ca). The patient has spots, moles and lesions to be evaluated, some may be new or changing and the patient has concerns that these could be cancer.  New patient referral from Dr. Durward Parcel.  The following portions of the chart were reviewed this encounter and updated as appropriate:   Tobacco  Allergies  Meds  Problems  Med Hx  Surg Hx  Fam Hx     Review of Systems:  No other skin or systemic complaints except as noted in HPI or Assessment and Plan.  Objective  Well appearing patient in no apparent distress; mood and affect are within normal limits.  A focused examination was performed including face, scalp, chest. Relevant physical exam findings are noted in the Assessment and Plan.  L cheek x 1 Stuck on waxy paps with erythema     L scalp mastoid 1.4cm fleshy pap     R clavicle 1.1cm brown pap      Assessment & Plan  Inflamed seborrheic keratosis L cheek x 1  Symptomatic, irritating, patient would like treated.   Destruction of lesion - L cheek x 1 Complexity: simple   Destruction method: cryotherapy   Informed consent: discussed and consent obtained   Timeout:  patient name, date of birth, surgical site, and procedure verified Lesion destroyed using liquid nitrogen: Yes   Region frozen until ice ball extended beyond lesion: Yes   Outcome: patient tolerated procedure well with no complications   Post-procedure details: wound care instructions given    Neoplasm of skin (2) L scalp mastoid  Epidermal / dermal shaving  Lesion diameter (cm):  1.4 Informed consent: discussed and consent obtained   Timeout: patient name, date of birth, surgical site, and procedure verified   Procedure prep:  Patient was  prepped and draped in usual sterile fashion Prep type:  Isopropyl alcohol Anesthesia: the lesion was anesthetized in a standard fashion   Anesthetic:  1% lidocaine w/ epinephrine 1-100,000 buffered w/ 8.4% NaHCO3 Instrument used: flexible razor blade   Hemostasis achieved with: pressure, aluminum chloride and electrodesiccation   Outcome: patient tolerated procedure well   Post-procedure details: sterile dressing applied and wound care instructions given   Dressing type: bandage and bacitracin    Specimen 1 - Surgical pathology Differential Diagnosis: D48.5 Irritated Nevus r/o Atypia  Check Margins: yes 1.4cm fleshy pap  R clavicle  Epidermal / dermal shaving  Lesion diameter (cm):  1.1 Informed consent: discussed and consent obtained   Timeout: patient name, date of birth, surgical site, and procedure verified   Procedure prep:  Patient was prepped and draped in usual sterile fashion Prep type:  Isopropyl alcohol Anesthesia: the lesion was anesthetized in a standard fashion   Anesthetic:  1% lidocaine w/ epinephrine 1-100,000 buffered w/ 8.4% NaHCO3 Instrument used: flexible razor blade   Hemostasis achieved with: pressure, aluminum chloride and electrodesiccation   Outcome: patient tolerated procedure well   Post-procedure details: sterile dressing applied and wound care instructions given   Dressing type: bandage and bacitracin    Specimen 2 - Surgical pathology Differential Diagnosis: D48.5 Irritated Nevus r/o Atypia  Check Margins: yes 1.1cm brown pap  Actinic Damage - chronic, secondary to cumulative UV radiation exposure/sun exposure over time - diffuse scaly  erythematous macules with underlying dyspigmentation - Recommend daily broad spectrum sunscreen SPF 30+ to sun-exposed areas, reapply every 2 hours as needed.  - Recommend staying in the shade or wearing long sleeves, sun glasses (UVA+UVB protection) and wide brim hats (4-inch brim around the entire  circumference of the hat). - Call for new or changing lesions.  Seborrheic Keratoses - Stuck-on, waxy, tan-brown papules and/or plaques  - Benign-appearing - Discussed benign etiology and prognosis. - Observe - Call for any changes  Return in about 3 months (around 04/17/2022) for recheck ISK.  I, Othelia Pulling, RMA, am acting as scribe for Sarina Ser, MD . Documentation: I have reviewed the above documentation for accuracy and completeness, and I agree with the above.  Sarina Ser, MD

## 2022-01-20 ENCOUNTER — Encounter: Payer: Self-pay | Admitting: Dermatology

## 2022-01-21 ENCOUNTER — Telehealth: Payer: Self-pay

## 2022-01-21 NOTE — Telephone Encounter (Signed)
Patient informed of pathology results 

## 2022-01-21 NOTE — Telephone Encounter (Signed)
-----   Message from Ralene Bathe, MD sent at 01/21/2022 11:42 AM EDT ----- Diagnosis 1. Skin , left scalp mastoid MELANOCYTIC NEVUS WITH HYPERPIGMENTATION, IRRITATED, DEEP MARGIN INVOLVED 2. Skin , right clavicle MELANOCYTIC NEVUS, INTRADERMAL TYPE, IRRITATED, DEEP MARGIN INVOLVED  1&2 - both benign mole No further treatment needed

## 2022-02-17 ENCOUNTER — Other Ambulatory Visit: Payer: Self-pay

## 2022-02-17 ENCOUNTER — Ambulatory Visit: Payer: BLUE CROSS/BLUE SHIELD | Admitting: Gastroenterology

## 2022-02-17 VITALS — BP 159/92 | HR 65 | Temp 98.0°F | Ht 72.0 in | Wt 240.5 lb

## 2022-02-17 DIAGNOSIS — G8929 Other chronic pain: Secondary | ICD-10-CM

## 2022-02-17 DIAGNOSIS — M545 Low back pain, unspecified: Secondary | ICD-10-CM

## 2022-02-17 MED ORDER — CYCLOBENZAPRINE HCL 10 MG PO TABS: 10.0000 mg | ORAL_TABLET | Freq: Three times a day (TID) | ORAL | 0 refills | Status: AC | PRN

## 2022-02-17 NOTE — Progress Notes (Unsigned)
Cephas Darby, MD [address]  [place]  [place], [place] [address]  Main: [phone]  Fax: [phone]    Gastroenterology Consultation  Referring Provider:     Durward Parcel, MD Primary Care Physician:  Durward Parcel, MD Primary Gastroenterologist:  Dr. Cephas Darby Reason for Consultation: Right mid back pain        HPI:   Manuel Jones is a 46 y.o. Caucasian male referred by Dr. Durward Parcel, MD  for consultation & management of 3 months history of epigastric/right upper quadrant pain, mostly burning, and sometimes sharp, postprandial, worse after eating, sometimes radiating to right lower back.  He underwent ultrasound abdomen which was unremarkable, LFTs and CBC normal.  He is started on omeprazole 20 mg once a day.  Patient went to ER in early December secondary to chest pain radiating to left arm and EKG was negative, troponins normal.  He was started on H2 blocker and sucralfate.  Currently, he is only taking PPI.  He started taking aspirin 325 mg daily since ER visit on his own.  He also reports lower abdominal discomfort, soft brown stools.  He denies any other GI symptoms.  He quit smoking about 3 months ago after his ER visit.  He used to be heavy smoker.  He is a widower, wife died 30 years ago, lives with his mom.  He does machine work.  He denies history of depression, anxiety or stress.  Follow-up visit 10/27/2018 Patient reports that his epigastric pain has improved.  He underwent CT abdomen and pelvis which was unremarkable.  He had EGD which was unremarkable except for hiatal hernia.  Continues to take omeprazole 40 mg once a day  Follow-up visit 10/31/2020 Patient is here for follow-up of 3 weeks history of left upper quadrant pain associated with severe heartburn.  Patient reports that since January, he started drinking [facility] several times daily and he gained at least 20 pounds.  He was taking omeprazole 40 mg daily and he recently restarted it  after ER visit.  His labs including lipase, CMP, CBC space and urine analysis were unremarkable. He denies any other GI symptoms.  He had history of erosive esophagitis, confirmed healing  Follow-up visit 02/18/2022 Patient reports that he has been experiencing intermittent severe right mid back pain below the rib cage as well as mild right upper quadrant discomfort.  This has been ongoing for about 6 months.  He underwent right upper quadrant ultrasound in April 2023 which was unremarkable with no evidence of cholelithiasis.  He had right flank pain in September 2022, underwent CT renal stone study protocol, which revealed colonic diverticulosis only.  His labs including CBC, lipase and CMP were unremarkable.  Patient reports that pain is severe in the right side of his back compared to right upper quadrant.  And, it is positional.  Sometimes, he does notice that the pain gets worse after fatty meal.  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  He denies any early satiety, abdominal bloating, nausea or vomiting, change in bowel habits, weight loss   NSAIDs: None  Antiplts/Anticoagulants/Anti thrombotics: None  GI Procedures:  EGD and colonoscopy 06/09/2018 - Normal duodenal bulb and second portion of the duodenum. - Medium-sized hiatal hernia. - Normal stomach. Biopsied. - Non-bleeding esophageal ulcers. - LA Grade D erosive esophagitis.      - The examined portion of the ileum was normal. - Diverticulosis in the sigmoid colon. -  The entire examined colon is normal. - The distal rectum and anal verge are normal on retroflexion view. - No specimens collected.   EGD 09/26/2018 Small hiatal hernia Normal esophagus Normal stomach Normal duodenum  His father passed away from colon cancer in his 63s  Past Medical History:  Diagnosis Date   GERD (gastroesophageal reflux disease)    Wears dentures    full upper    Past Surgical History:  Procedure Laterality Date    APPENDECTOMY     COLONOSCOPY WITH PROPOFOL N/A 06/09/2018   Procedure: COLONOSCOPY WITH PROPOFOL;  Surgeon: Lin Landsman, MD;  Location: [facility];  Service: Endoscopy;  Laterality: N/A;   ESOPHAGOGASTRODUODENOSCOPY (EGD) WITH PROPOFOL N/A 06/09/2018   Procedure: ESOPHAGOGASTRODUODENOSCOPY (EGD) WITH BIOPSIES;  Surgeon: Lin Landsman, MD;  Location: [facility];  Service: Endoscopy;  Laterality: N/A;   ESOPHAGOGASTRODUODENOSCOPY (EGD) WITH PROPOFOL N/A 09/29/2018   Procedure: ESOPHAGOGASTRODUODENOSCOPY (EGD) WITH PROPOFOL;  Surgeon: Lin Landsman, MD;  Location: [facility];  Service: Endoscopy;  Laterality: N/A;    Current Outpatient Medications:    cyclobenzaprine (FLEXERIL) 10 MG tablet, Take 1 tablet (10 mg total) by mouth 3 (three) times daily as needed for muscle spasms., Disp: 30 tablet, Rfl: 0   fexofenadine-pseudoephedrine (ALLEGRA-D 24) 180-240 MG 24 hr tablet, Take 1 tablet by mouth daily., Disp: , Rfl:    fluticasone (FLONASE) 50 MCG/ACT nasal spray, USE 2 SPRAY(S) IN EACH NOSTRIL ONCE DAILY FOR ALLERGIES, Disp: , Rfl:    Family History  Problem Relation Age of Onset   Colon cancer Father      Social History   Tobacco Use   Smoking status: Former    Packs/day: 0.50    Years: 18.00    Total pack years: 9.00    Types: Cigarettes    Quit date: 04/13/2018    Years since quitting: 3.8   Smokeless tobacco: Never  Vaping Use   Vaping Use: Never used  Substance Use Topics   Alcohol use: Not Currently   Drug use: Not Currently    Allergies as of 02/17/2022   (No Known Allergies)    Review of Systems:    All systems reviewed and negative except where noted in HPI.   Physical Exam:  BP (!) 159/92 (BP Location: Left Arm, Patient Position: Sitting, Cuff Size: Normal)   Pulse 65   Temp 98 F (36.7 C) (Oral)   Ht 6' (1.829 m)   Wt 240 lb 8 oz (109.1 kg)   BMI 32.62 kg/m  No LMP for male patient.  General:   Alert,   Well-developed, well-nourished, pleasant and cooperative in NAD Head:  Normocephalic and atraumatic. Eyes:  Sclera clear, no icterus.   Conjunctiva pink. Ears:  Normal auditory acuity. Nose:  No deformity, discharge, or lesions. Mouth:  No deformity or lesions,oropharynx pink & moist. Neck:  Supple; no masses or thyromegaly. Lungs:  Respirations even and unlabored.  Clear throughout to auscultation.   No wheezes, crackles, or rhonchi. No acute distress. Heart:  Regular rate and rhythm; no murmurs, clicks, rubs, or gallops. Abdomen:  Normal bowel sounds. Soft, nontender, non-distended without masses, hepatosplenomegaly or hernias noted.  No guarding or rebound tenderness.   Rectal: Not performed Msk:  Symmetrical without gross deformities. Good, equal movement & strength bilaterally. Pulses:  Normal pulses noted. Extremities:  No clubbing or edema.  No cyanosis. Neurologic:  Alert and oriented x3;  grossly normal neurologically. Skin:  Intact without significant lesions or rashes. No  jaundice. Psych:  Alert and cooperative. Normal mood and affect.  Imaging Studies: Reviewed  Assessment and Plan:   Manuel Jones is a 46 y.o. male with history of tobacco use, history of erosive esophagitis, confirmed healing is seen in consultation for 6 months history of intermittent right-sided mid back pain and mild right upper quadrant discomfort.  Symptoms are triggered by change in position and sometimes after a heavy meal.  Right upper quadrant ultrasound did not reveal any cholelithiasis Discussed with patient that likely his pain is musculoskeletal, therefore advised to try Flexeril and anti-inflammatory medications such as Tylenol or NSAID for about a week.  He will contact my office if his symptoms are persistent. Next step would be HIDA scan to evaluate for biliary dyskinesia if his symptoms are persistent   Follow up as needed   Cephas Darby, MD

## 2022-02-18 ENCOUNTER — Encounter: Payer: Self-pay | Admitting: Gastroenterology

## 2022-04-17 ENCOUNTER — Ambulatory Visit: Payer: BLUE CROSS/BLUE SHIELD | Admitting: Dermatology

## 2022-04-17 VITALS — BP 163/77 | HR 67

## 2022-04-17 DIAGNOSIS — D229 Melanocytic nevi, unspecified: Secondary | ICD-10-CM

## 2022-04-17 DIAGNOSIS — D225 Melanocytic nevi of trunk: Secondary | ICD-10-CM | POA: Diagnosis not present

## 2022-04-17 DIAGNOSIS — D224 Melanocytic nevi of scalp and neck: Secondary | ICD-10-CM

## 2022-04-17 DIAGNOSIS — L820 Inflamed seborrheic keratosis: Secondary | ICD-10-CM

## 2022-04-17 NOTE — Progress Notes (Signed)
   Follow-Up Visit   Subjective  Manuel Jones is a 47 y.o. male who presents for the following: recheck ISK (L cheek, 66mf/u, LN2 at last visit, some improvement but still there) and check spot (R cheek, irritated by razor). The patient has spots, moles and lesions to be evaluated, some may be new or changing and the patient has concerns.  The following portions of the chart were reviewed this encounter and updated as appropriate:   Tobacco  Allergies  Meds  Problems  Med Hx  Surg Hx  Fam Hx     Review of Systems:  No other skin or systemic complaints except as noted in HPI or Assessment and Plan.  Objective  Well appearing patient in no apparent distress; mood and affect are within normal limits.  A focused examination was performed including face, scalp, right clavicle. Relevant physical exam findings are noted in the Assessment and Plan.  L cheek x 1, R cheek x 1 (2) Residual stuck on waxy pap with erythema L cheek, Stuck on waxy pap with erythema R cheek   Assessment & Plan   Benign Nevi, bx proven - L scalp mastoid, R clavicle, clear  Inflamed seborrheic keratosis (2) L cheek x 1, R cheek x 1 Symptomatic, irritating, patient would like treated. Destruction of lesion - L cheek x 1, R cheek x 1 Complexity: simple   Destruction method: cryotherapy   Informed consent: discussed and consent obtained   Timeout:  patient name, date of birth, surgical site, and procedure verified Lesion destroyed using liquid nitrogen: Yes   Region frozen until ice ball extended beyond lesion: Yes   Outcome: patient tolerated procedure well with no complications   Post-procedure details: wound care instructions given    Return if symptoms worsen or fail to improve.  I, SOthelia Pulling RMA, am acting as scribe for DSarina Ser MD . Documentation: I have reviewed the above documentation for accuracy and completeness, and I agree with the above.  DSarina Ser MD

## 2022-04-17 NOTE — Patient Instructions (Addendum)
Cryotherapy Aftercare  Wash gently with soap and water everyday.   Apply Vaseline and Band-Aid daily until healed.     Due to recent changes in healthcare laws, you may see results of your pathology and/or laboratory studies on MyChart before the doctors have had a chance to review them. We understand that in some cases there may be results that are confusing or concerning to you. Please understand that not all results are received at the same time and often the doctors may need to interpret multiple results in order to provide you with the best plan of care or course of treatment. Therefore, we ask that you please give us 2 business days to thoroughly review all your results before contacting the office for clarification. Should we see a critical lab result, you will be contacted sooner.   If You Need Anything After Your Visit  If you have any questions or concerns for your doctor, please call our main line at [phone] and press option 4 to reach your doctor's medical assistant. If no one answers, please leave a voicemail as directed and we will return your call as soon as possible. Messages left after 4 pm will be answered the following business day.   You may also send us a message via MyChart. We typically respond to MyChart messages within 1-2 business days.  For prescription refills, please ask your pharmacy to contact our office. Our fax number is [phone].  If you have an urgent issue when the clinic is closed that cannot wait until the next business day, you can page your doctor at the number below.    Please note that while we do our best to be available for urgent issues outside of office hours, we are not available 24/7.   If you have an urgent issue and are unable to reach us, you may choose to seek medical care at your doctor's office, retail clinic, urgent care center, or emergency room.  If you have a medical emergency, please immediately call 911 or go to the  emergency department.  Pager Numbers  - Dr. Kowalski: [phone]  - Dr. Moye: [phone]  - Dr. Kallal: [phone]  In the event of inclement weather, please call our main line at [phone] for an update on the status of any delays or closures.  Dermatology Medication Tips: Please keep the boxes that topical medications come in in order to help keep track of the instructions about where and how to use these. Pharmacies typically print the medication instructions only on the boxes and not directly on the medication tubes.   If your medication is too expensive, please contact our office at [phone] option 4 or send us a message through MyChart.   We are unable to tell what your co-pay for medications will be in advance as this is different depending on your insurance coverage. However, we may be able to find a substitute medication at lower cost or fill out paperwork to get insurance to cover a needed medication.   If a prior authorization is required to get your medication covered by your insurance company, please allow us 1-2 business days to complete this process.  Drug prices often vary depending on where the prescription is filled and some pharmacies may offer cheaper prices.  The website www.goodrx.com contains coupons for medications through different pharmacies. The prices here do not account for what the cost may be with help from insurance (it may be cheaper with your insurance), but the website can   give you the price if you did not use any insurance.  - You can print the associated coupon and take it with your prescription to the pharmacy.  - You may also stop by our office during regular business hours and pick up a GoodRx coupon card.  - If you need your prescription sent electronically to a different pharmacy, notify our office through [facility] MyChart or by phone at [phone] option 4.     Si Usted Necesita Algo Despus de Su Visita  Tambin puede  enviarnos un mensaje a travs de MyChart. Por lo general respondemos a los mensajes de MyChart en el transcurso de 1 a 2 das hbiles.  Para renovar recetas, por favor pida a su farmacia que se ponga en contacto con nuestra oficina. Nuestro nmero de fax es el [phone].  Si tiene un asunto urgente cuando la clnica est cerrada y que no puede esperar hasta el siguiente da hbil, puede llamar/localizar a su doctor(a) al nmero que aparece a continuacin.   Por favor, tenga en cuenta que aunque hacemos todo lo posible para estar disponibles para asuntos urgentes fuera del horario de oficina, no estamos disponibles las 24 horas del da, los 7 das de la semana.   Si tiene un problema urgente y no puede comunicarse con nosotros, puede optar por buscar atencin mdica  en el consultorio de su doctor(a), en una clnica privada, en un centro de atencin urgente o en una sala de emergencias.  Si tiene una emergencia mdica, por favor llame inmediatamente al 911 o vaya a la sala de emergencias.  Nmeros de bper  - Dr. Kowalski: [phone]  - Dra. Moye: [phone]  - Dra. Pilar: [phone]  En caso de inclemencias del tiempo, por favor llame a nuestra lnea principal al [phone] para una actualizacin sobre el estado de cualquier retraso o cierre.  Consejos para la medicacin en dermatologa: Por favor, guarde las cajas en las que vienen los medicamentos de uso tpico para ayudarle a seguir las instrucciones sobre dnde y cmo usarlos. Las farmacias generalmente imprimen las instrucciones del medicamento slo en las cajas y no directamente en los tubos del medicamento.   Si su medicamento es muy caro, por favor, pngase en contacto con nuestra oficina llamando al [phone] y presione la opcin 4 o envenos un mensaje a travs de MyChart.   No podemos decirle cul ser su copago por los medicamentos por adelantado ya que esto es diferente dependiendo de la cobertura de su seguro.  Sin embargo, es posible que podamos encontrar un medicamento sustituto a menor costo o llenar un formulario para que el seguro cubra el medicamento que se considera necesario.   Si se requiere una autorizacin previa para que su compaa de seguros cubra su medicamento, por favor permtanos de 1 a 2 das hbiles para completar este proceso.  Los precios de los medicamentos varan con frecuencia dependiendo del lugar de dnde se surte la receta y alguna farmacias pueden ofrecer precios ms baratos.  El sitio web www.goodrx.com tiene cupones para medicamentos de diferentes farmacias. Los precios aqu no tienen en cuenta lo que podra costar con la ayuda del seguro (puede ser ms barato con su seguro), pero el sitio web puede darle el precio si no utiliz ningn seguro.  - Puede imprimir el cupn correspondiente y llevarlo con su receta a la farmacia.  - Tambin puede pasar por nuestra oficina durante el horario de atencin regular y recoger una tarjeta de cupones de GoodRx.  -   Si necesita que su receta se enve electrnicamente a una farmacia diferente, informe a nuestra oficina a travs de MyChart de [facility] o por telfono llamando al [phone] y presione la opcin 4.  

## 2022-04-19 ENCOUNTER — Encounter: Payer: Self-pay | Admitting: Dermatology

## 2022-05-08 IMAGING — CT CT RENAL STONE PROTOCOL
2 of 4 series · 16 of 46 positions shown, 18 images · non-contrast
Comparison: 10/26/2018

CLINICAL DATA: Right flank pain for 3 weeks. Microscopic hematuria.

EXAM:
CT ABDOMEN AND PELVIS WITHOUT CONTRAST
TECHNIQUE: Multidetector CT imaging of the abdomen and pelvis was performed
following the standard protocol without IV contrast.

[Series 2: renal stone 5.00 · axial · 0.75mm/px · z∈[-1542,-1082]mm · 13 of 100 slices shown, 15 images]
[im 4/100  soft-tissue]
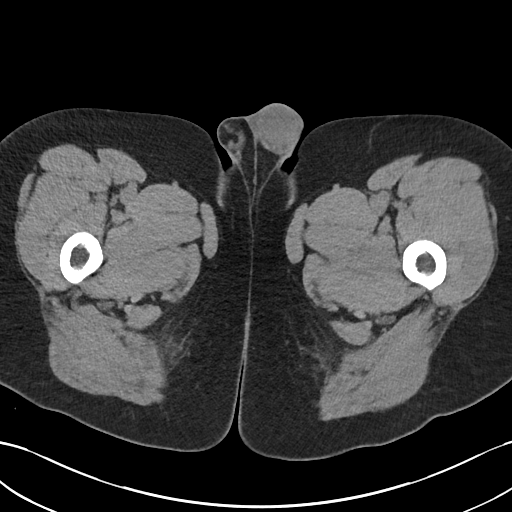
[im 4/100  bone]
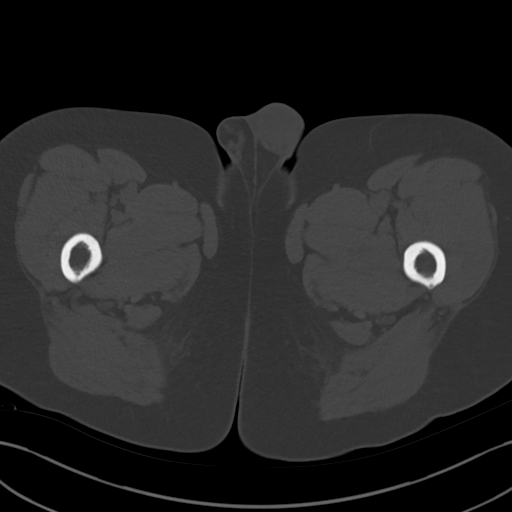
[im 12/100  soft-tissue]
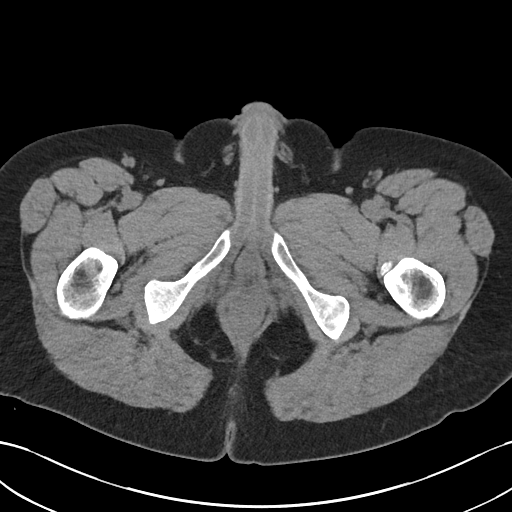
[im 20/100  soft-tissue]
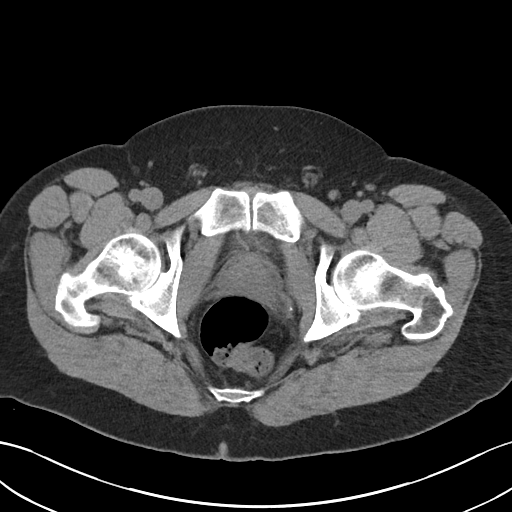
[im 28/100  soft-tissue]
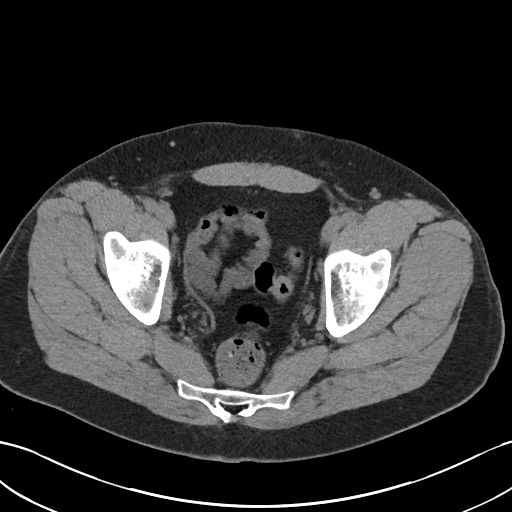
[im 36/100  soft-tissue]
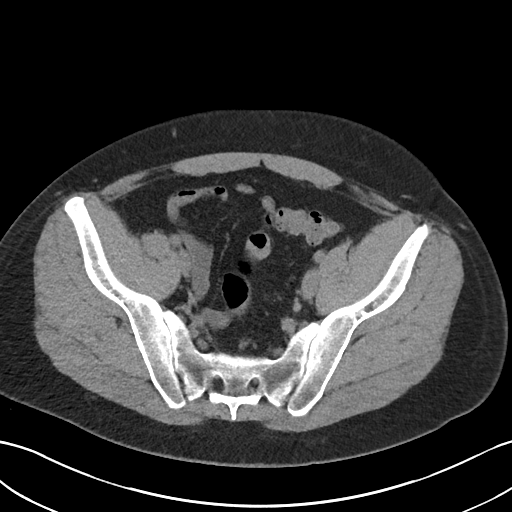
[im 44/100  soft-tissue]
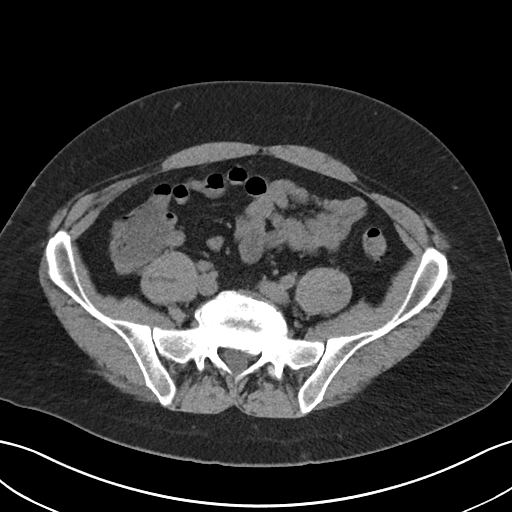
[im 52/100  soft-tissue]
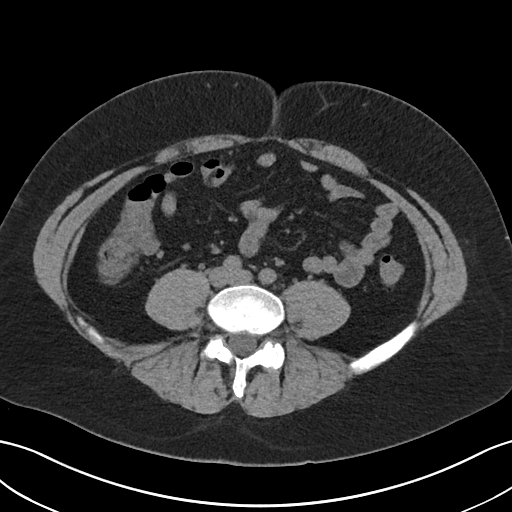
[im 56/100  soft-tissue]
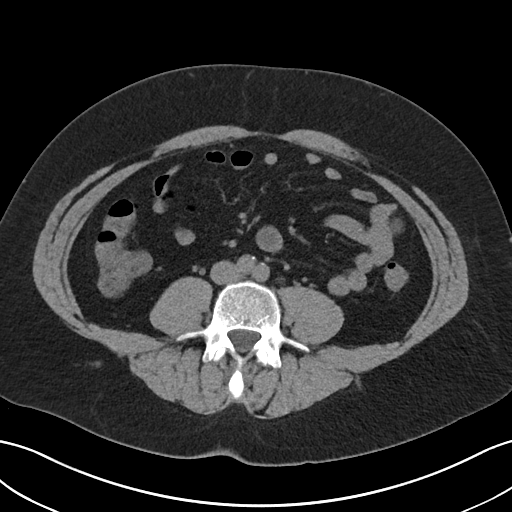
[im 64/100  soft-tissue]
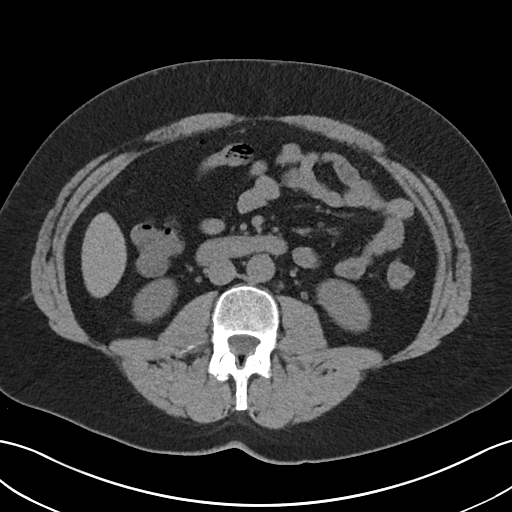
[im 64/100  bone]
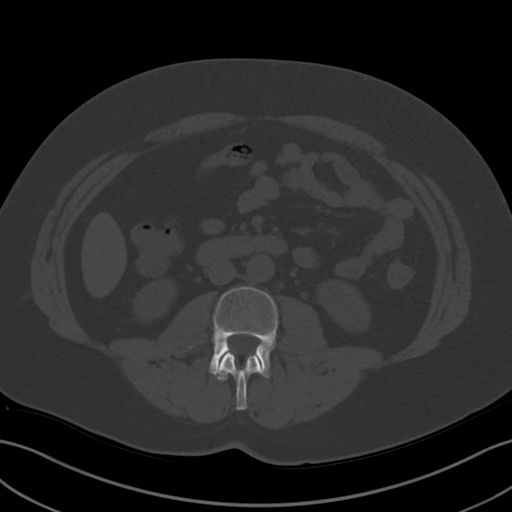
[im 72/100  soft-tissue]
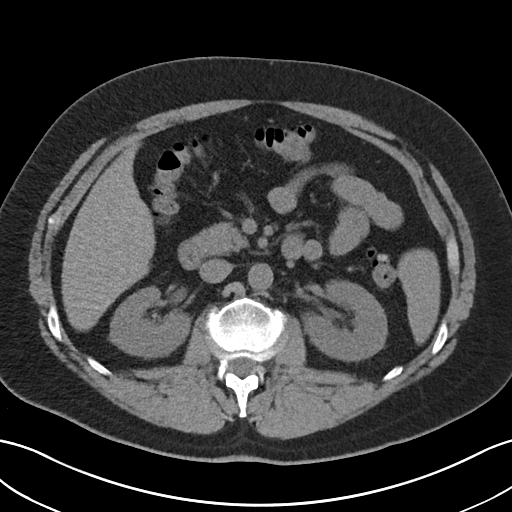
[im 80/100  soft-tissue]
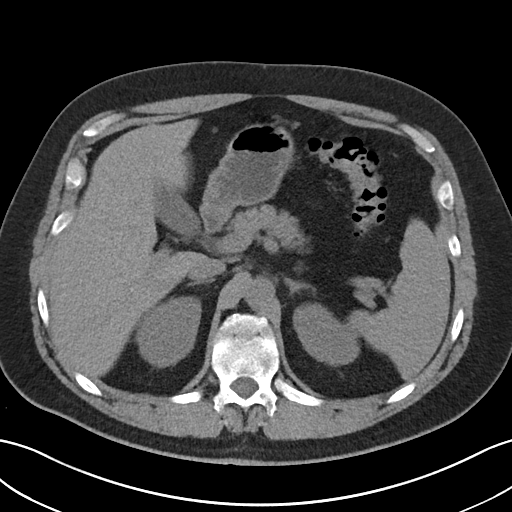
[im 88/100  soft-tissue]
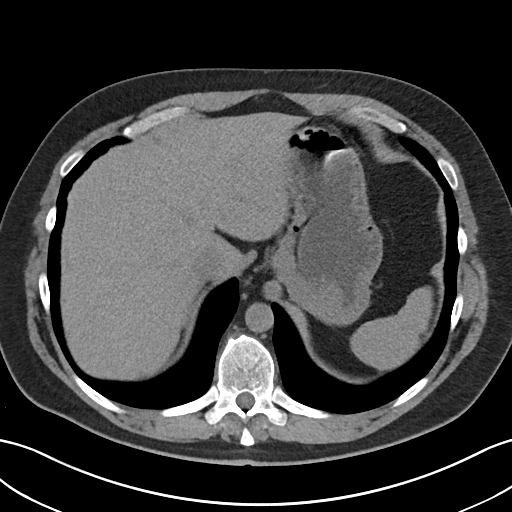
[im 96/100  soft-tissue]
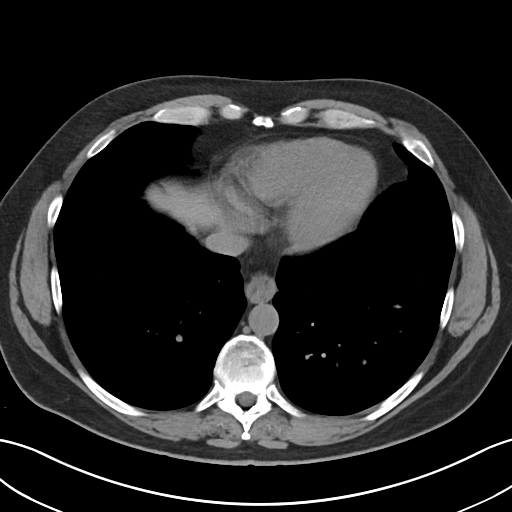

[Series 4: renal stone 2.00 cor · coronal · 0.75mm/px · 3 of 159 slices shown]
[im 53/159  soft-tissue]
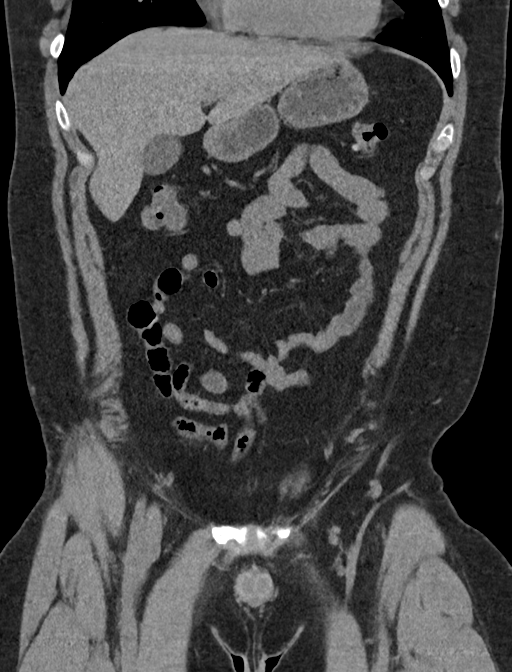
[im 71/159  soft-tissue]
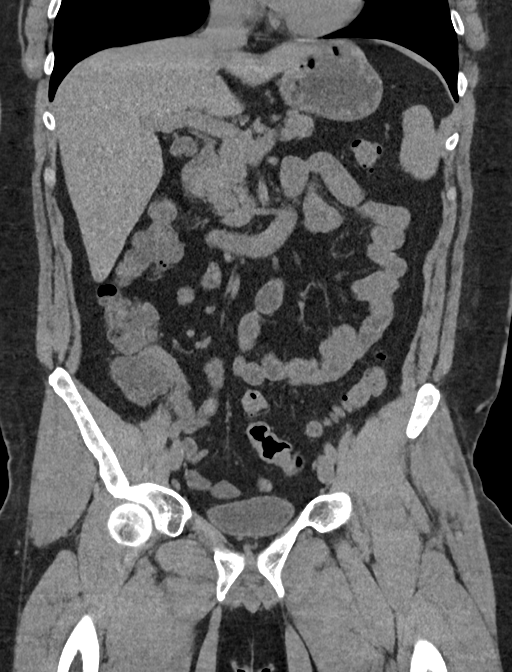
[im 88/159  soft-tissue]
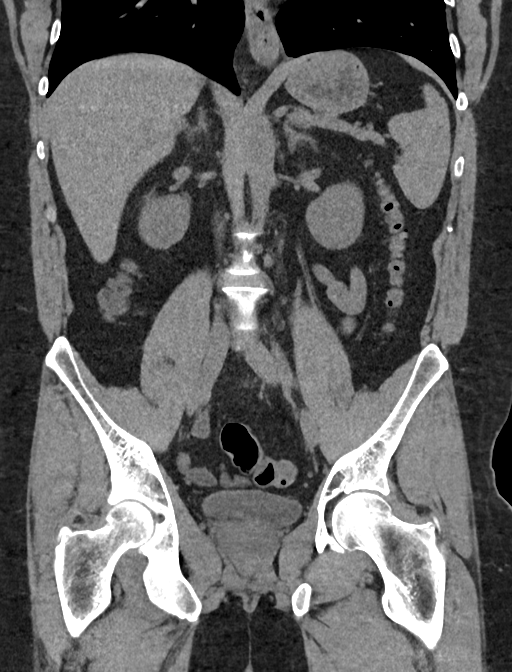

[16 of 46 positions shown; findings below may reference images not displayed]

FINDINGS: Lower chest: No acute findings.

Hepatobiliary: No mass visualized on this unenhanced exam.
Gallbladder is unremarkable. No evidence of biliary ductal
dilatation.

Pancreas: No mass or inflammatory process visualized on this
unenhanced exam.

Spleen:  Within normal limits in size.

Adrenals/Urinary tract: No evidence of urolithiasis or
hydronephrosis. Unremarkable unopacified urinary bladder.

Stomach/Bowel: No evidence of obstruction, inflammatory process, or
abnormal fluid collections. Left sided colonic diverticulosis is
noted, however there is no evidence of diverticulitis.

Vascular/Lymphatic: No pathologically enlarged lymph nodes
identified. No evidence of abdominal aortic aneurysm.

Reproductive:  No mass or other significant abnormality.

Other:  None.

Musculoskeletal:  No suspicious bone lesions identified.
IMPRESSION: No evidence of urolithiasis, hydronephrosis, or other acute
findings.

Colonic diverticulosis. No radiographic evidence of diverticulitis.

## 2022-12-16 IMAGING — US US ABDOMEN LIMITED
1 series · 14 of 25 positions shown · non-contrast
Comparison: None.

CLINICAL DATA: Colicky right upper quadrant abdominal pain.

EXAM:
ULTRASOUND ABDOMEN LIMITED RIGHT UPPER QUADRANT

[Series 1: us abdomen limited ruq (liver/gb) · 14 of 39 slices shown]
[im 1/39]
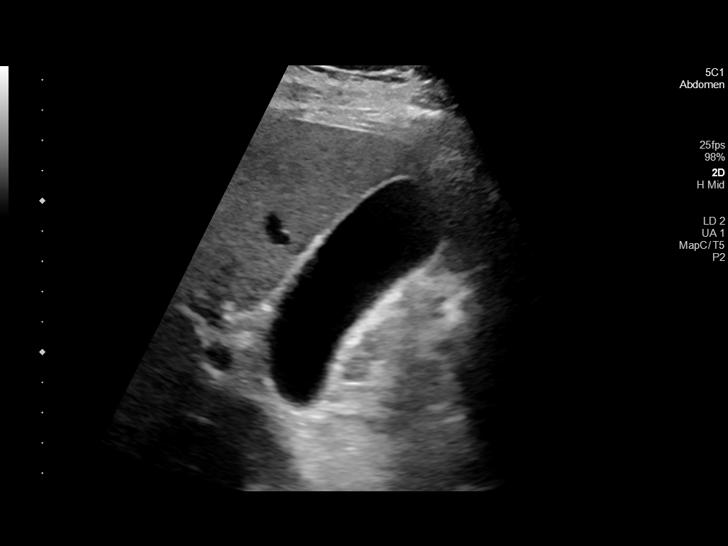
[im 4/39]
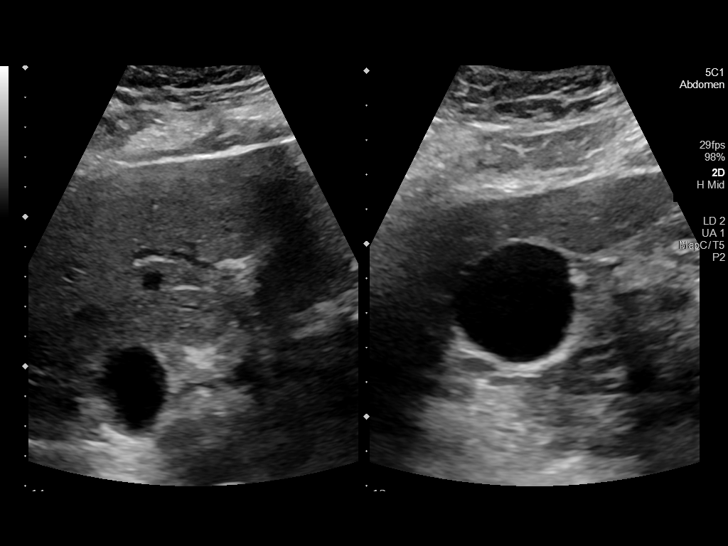
[im 7/39]
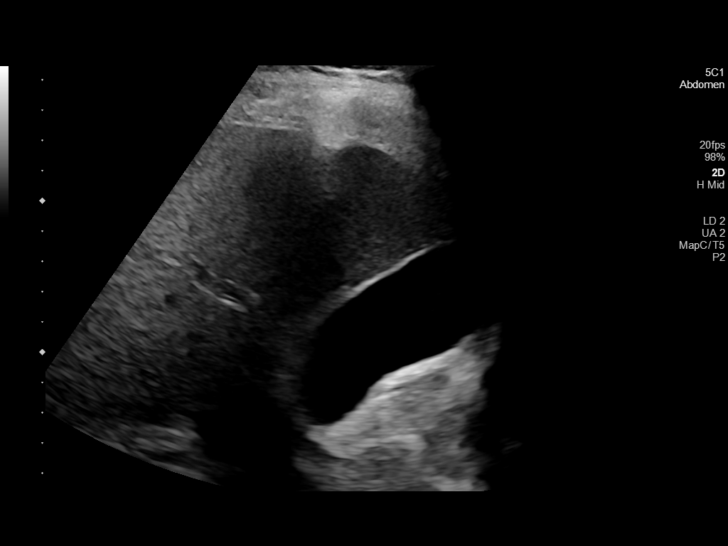
[im 10/39]
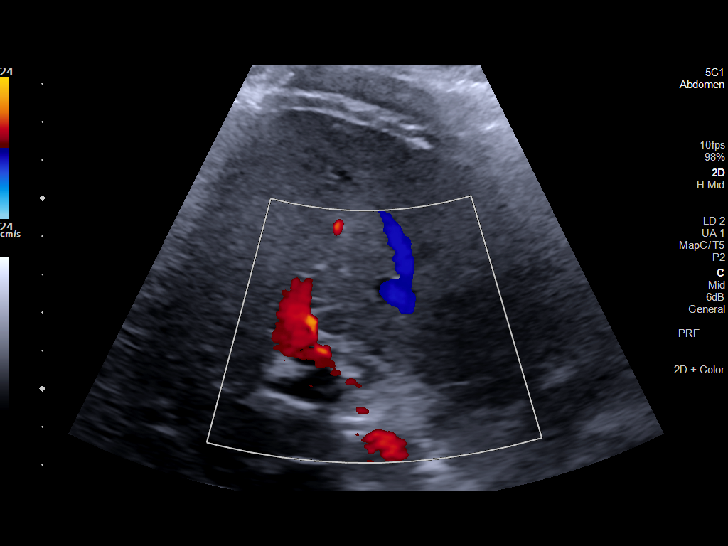
[im 13/39]
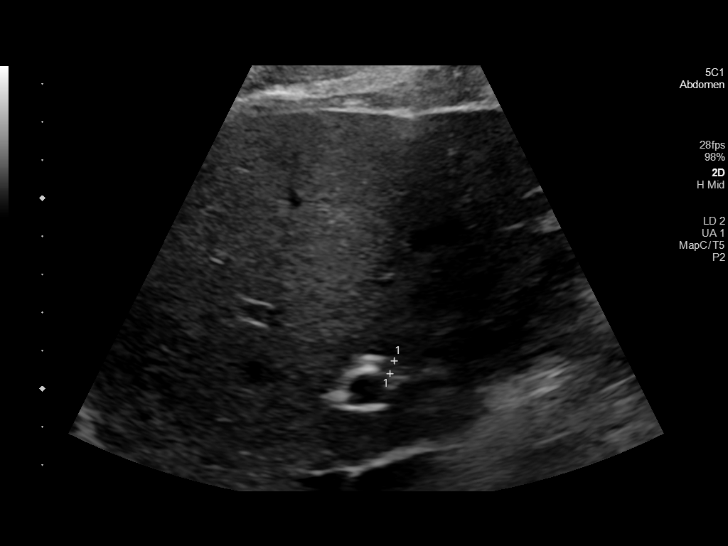
[im 15/39]
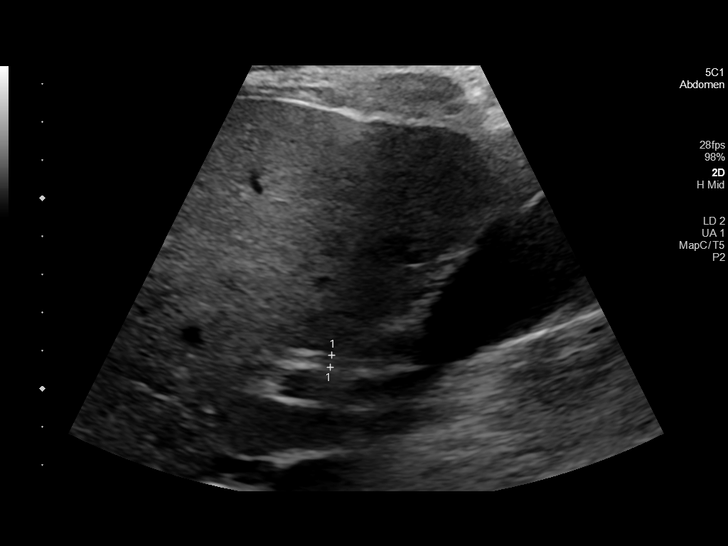
[im 18/39]
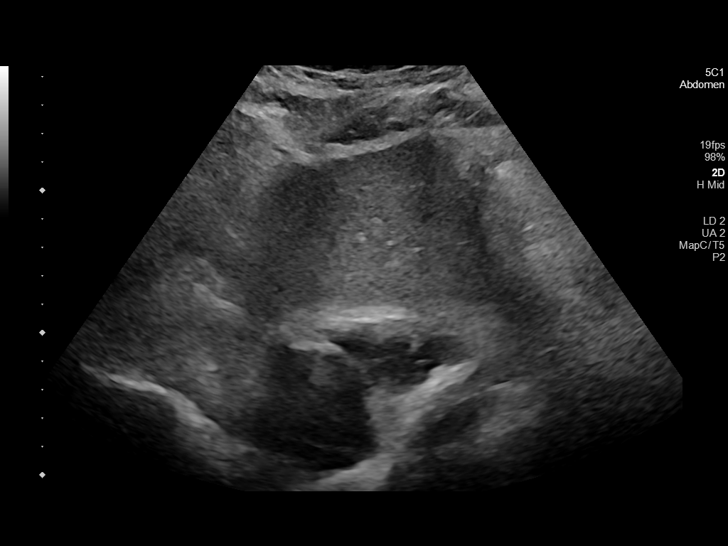
[im 21/39]
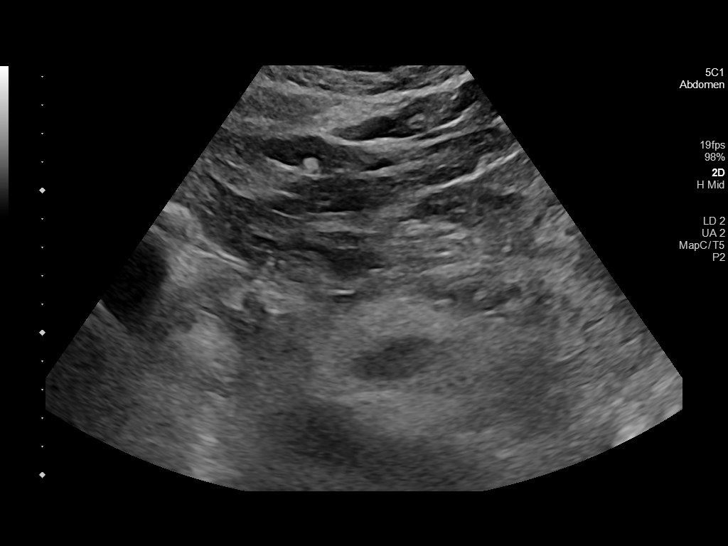
[im 24/39]
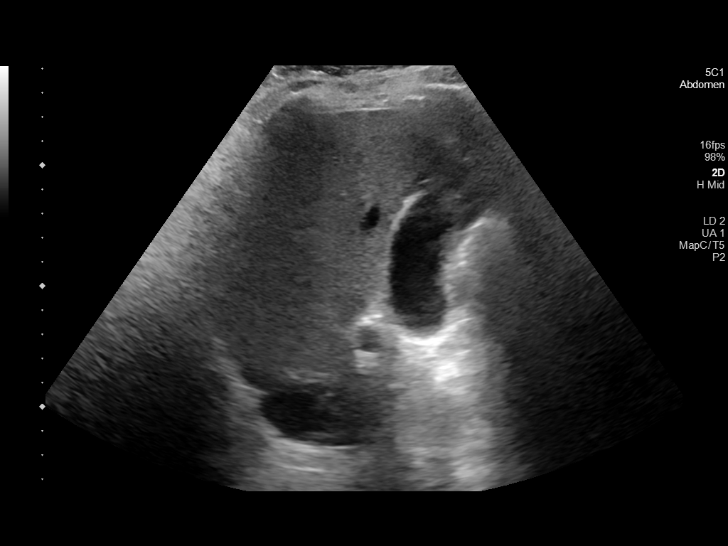
[im 26/39]
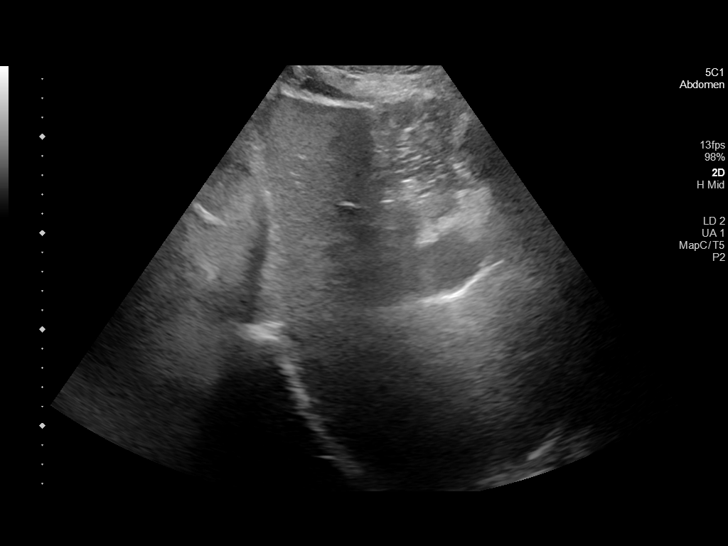
[im 29/39]
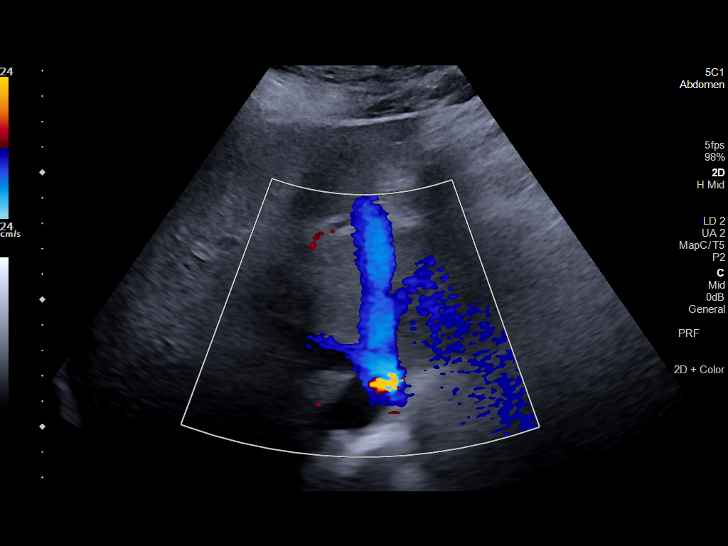
[im 32/39]
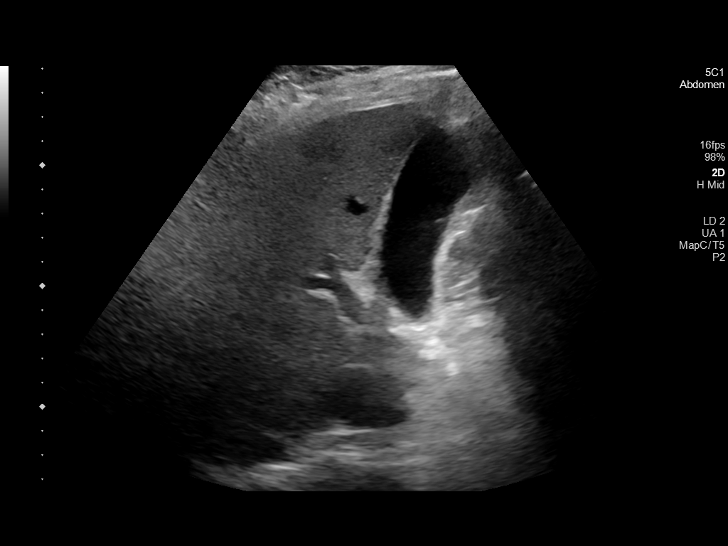
[im 35/39]
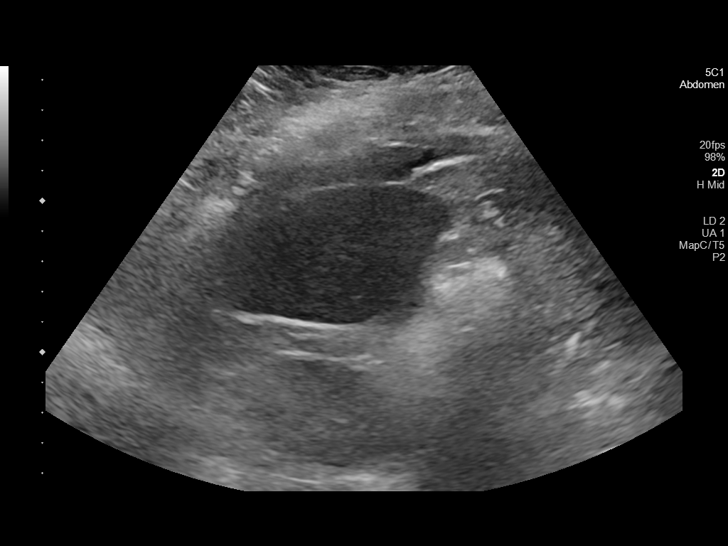
[im 39/39]
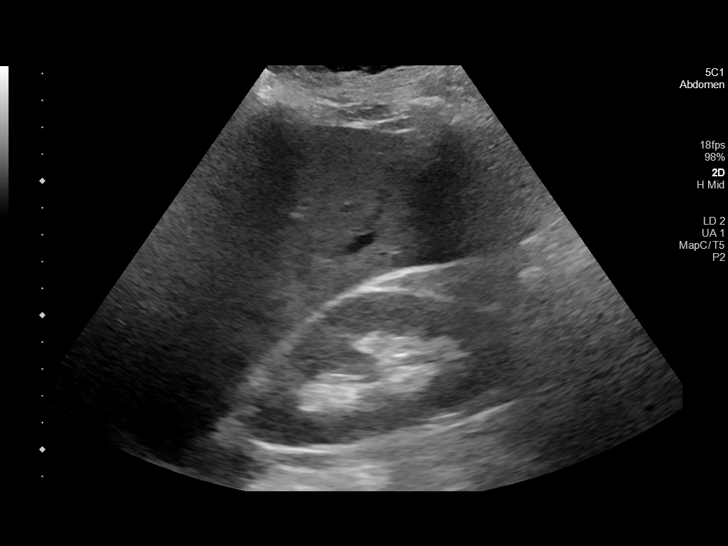

[14 of 25 positions shown; findings below may reference images not displayed]

FINDINGS: Gallbladder:

No gallstones or wall thickening visualized. No sonographic Murphy
sign noted by sonographer.

Common bile duct:

Diameter: 4 mm

Liver:

No focal lesion identified. Within normal limits in parenchymal
echogenicity. Portal vein is patent on color Doppler imaging with
normal direction of blood flow towards the liver.

Other: None.
IMPRESSION: No cholelithiasis or sonographic evidence for acute cholecystitis.

## 2023-12-10 ENCOUNTER — Other Ambulatory Visit: Payer: Self-pay

## 2023-12-10 ENCOUNTER — Telehealth: Payer: Self-pay

## 2023-12-10 DIAGNOSIS — Z1211 Encounter for screening for malignant neoplasm of colon: Secondary | ICD-10-CM

## 2023-12-10 DIAGNOSIS — Z800 Family history of malignant neoplasm of digestive organs: Secondary | ICD-10-CM

## 2023-12-10 MED ORDER — NA SULFATE-K SULFATE-MG SULF 17.5-3.13-1.6 GM/177ML PO SOLN: 1.0000 | Freq: Once | ORAL | 0 refills | Status: AC

## 2023-12-10 NOTE — Telephone Encounter (Signed)
 Pt requesting call back to schedule colonoscopy

## 2023-12-10 NOTE — Telephone Encounter (Signed)
 Gastroenterology Pre-Procedure Review  Request Date: 03/22/24 Requesting Physician: Dr. Jinny  PATIENT REVIEW QUESTIONS: The patient responded to the following health history questions as indicated:    1. Are you having any GI issues? no 2. Do you have a personal history of Polyps? no 3. Do you have a family history of Colon Cancer or Polyps? yes (father colon cancer diagnosed in his 81s) 4. Diabetes Mellitus? no 5. Joint replacements in the past 12 months?no 6. Major health problems in the past 3 months?no 7. Any artificial heart valves, MVP, or defibrillator?no    MEDICATIONS & ALLERGIES:    Patient reports the following regarding taking any anticoagulation/antiplatelet therapy:   Plavix, Coumadin, Eliquis, Xarelto, Lovenox, Pradaxa, Brilinta, or Effient? no Aspirin ? no  Patient confirms/reports the following medications:  Current Outpatient Medications  Medication Sig Dispense Refill   cyclobenzaprine  (FLEXERIL ) 10 MG tablet Take 1 tablet (10 mg total) by mouth 3 (three) times daily as needed for muscle spasms. 30 tablet 0   fexofenadine-pseudoephedrine (ALLEGRA-D 24) 180-240 MG 24 hr tablet Take 1 tablet by mouth daily.     fluticasone (FLONASE) 50 MCG/ACT nasal spray USE 2 SPRAY(S) IN EACH NOSTRIL ONCE DAILY FOR ALLERGIES     No current facility-administered medications for this visit.    Patient confirms/reports the following allergies:  No Known Allergies  No orders of the defined types were placed in this encounter.   AUTHORIZATION INFORMATION Primary Insurance: 1D#: Group #:  Secondary Insurance: 1D#: Group #:  SCHEDULE INFORMATION: Date: 03/22/24 Time: Location: [facility]

## 2024-03-14 ENCOUNTER — Other Ambulatory Visit: Payer: Self-pay

## 2024-03-14 MED ORDER — NA SULFATE-K SULFATE-MG SULF 17.5-3.13-1.6 GM/177ML PO SOLN: 1.0000 | Freq: Once | ORAL | 0 refills | Status: AC

## 2024-03-22 ENCOUNTER — Encounter: Payer: Self-pay | Admitting: Gastroenterology

## 2024-03-22 ENCOUNTER — Ambulatory Visit: Admitting: Anesthesiology

## 2024-03-22 ENCOUNTER — Ambulatory Visit
Admission: RE | Admit: 2024-03-22 | Discharge: 2024-03-22 | Disposition: A | Attending: Gastroenterology | Admitting: Gastroenterology

## 2024-03-22 ENCOUNTER — Encounter: Admission: RE | Disposition: A | Payer: Self-pay | Source: Home / Self Care | Attending: Gastroenterology

## 2024-03-22 DIAGNOSIS — Z1211 Encounter for screening for malignant neoplasm of colon: Secondary | ICD-10-CM

## 2024-03-22 DIAGNOSIS — K635 Polyp of colon: Secondary | ICD-10-CM

## 2024-03-22 DIAGNOSIS — Z800 Family history of malignant neoplasm of digestive organs: Secondary | ICD-10-CM

## 2024-03-22 HISTORY — PX: COLONOSCOPY: SHX5424

## 2024-03-22 HISTORY — PX: POLYPECTOMY: SHX149

## 2024-03-22 SURGERY — COLONOSCOPY
Anesthesia: General

## 2024-03-22 MED ORDER — LIDOCAINE HCL (CARDIAC) PF 100 MG/5ML IV SOSY
PREFILLED_SYRINGE | INTRAVENOUS | Status: DC | PRN
  Administered 2024-03-22: 100 mg via INTRAVENOUS

## 2024-03-22 MED ORDER — SODIUM CHLORIDE 0.9 % IV SOLN: INTRAVENOUS | Status: DC

## 2024-03-22 MED ORDER — PROPOFOL 500 MG/50ML IV EMUL
INTRAVENOUS | Status: DC | PRN
  Administered 2024-03-22: 50 mg via INTRAVENOUS
  Administered 2024-03-22: 150 ug/kg/min via INTRAVENOUS

## 2024-03-22 NOTE — H&P (Signed)
 Rogelia Copping, MD [facility] [address] [place], [place] [address] [phone] Fax : [phone]  Primary Care Physician:  Myrna Kay, MD Primary Gastroenterologist:  Dr. Copping  Pre-Procedure History & Physical: HPI:  Manuel Jones is a 48 y.o. male is here for an colonoscopy.   Past Medical History:  Diagnosis Date   GERD (gastroesophageal reflux disease)    Wears dentures    full upper    Past Surgical History:  Procedure Laterality Date   APPENDECTOMY     COLONOSCOPY WITH PROPOFOL  N/A 06/09/2018   Procedure: COLONOSCOPY WITH PROPOFOL ;  Surgeon: Unk Corinn Skiff, MD;  Location: [facility];  Service: Endoscopy;  Laterality: N/A;   ESOPHAGOGASTRODUODENOSCOPY (EGD) WITH PROPOFOL  N/A 06/09/2018   Procedure: ESOPHAGOGASTRODUODENOSCOPY (EGD) WITH BIOPSIES;  Surgeon: Unk Corinn Skiff, MD;  Location: [facility];  Service: Endoscopy;  Laterality: N/A;   ESOPHAGOGASTRODUODENOSCOPY (EGD) WITH PROPOFOL  N/A 09/29/2018   Procedure: ESOPHAGOGASTRODUODENOSCOPY (EGD) WITH PROPOFOL ;  Surgeon: Unk Corinn Skiff, MD;  Location: [facility];  Service: Endoscopy;  Laterality: N/A;    Prior to Admission medications   Medication Sig Start Date End Date Taking? Authorizing Provider  cyclobenzaprine  (FLEXERIL ) 10 MG tablet Take 1 tablet (10 mg total) by mouth 3 (three) times daily as needed for muscle spasms. 02/17/22   Unk Corinn Skiff, MD  fexofenadine-pseudoephedrine (ALLEGRA-D 24) 180-240 MG 24 hr tablet Take 1 tablet by mouth daily.    [provider]  fluticasone (FLONASE) 50 MCG/ACT nasal spray USE 2 SPRAY(S) IN EACH NOSTRIL ONCE DAILY FOR ALLERGIES 07/22/18   [provider]    Allergies as of 12/10/2023   (No Known Allergies)    Family History  Problem Relation Age of Onset   Colon cancer Father     Social History   Socioeconomic History   Marital status: Widowed    Spouse name: Not on file   Number of children:  Not on file   Years of education: Not on file   Highest education level: Not on file  Occupational History   Not on file  Tobacco Use   Smoking status: Former    Current packs/day: 0.00    Average packs/day: 0.5 packs/day for 18.0 years (9.0 ttl pk-yrs)    Types: Cigarettes    Start date: 04/13/2000    Quit date: 04/13/2018    Years since quitting: 5.9   Smokeless tobacco: Never  Vaping Use   Vaping status: Never Used  Substance and Sexual Activity   Alcohol use: Not Currently   Drug use: Not Currently   Sexual activity: Not on file  Other Topics Concern   Not on file  Social History Narrative   Not on file   Social Drivers of Health   Financial Resource Strain: Not on file  Food Insecurity: Not on file  Transportation Needs: Not on file  Physical Activity: Not on file  Stress: Not on file  Social Connections: Not on file  Intimate Partner Violence: Not on file    Review of Systems: See HPI, otherwise negative ROS  Physical Exam: BP (!) 135/90   Pulse 72   Temp (!) 96.9 F (36.1 C) (Temporal)   Resp 16   Ht 6' (1.829 m)   Wt 111.6 kg   SpO2 99%   BMI 33.36 kg/m  General:   Alert,  pleasant and cooperative in NAD Head:  Normocephalic and atraumatic. Neck:  Supple; no masses or thyromegaly. Lungs:  Clear throughout to auscultation.  Heart:  Regular rate and rhythm. Abdomen:  Soft, nontender and nondistended. Normal bowel sounds, without guarding, and without rebound.   Neurologic:  Alert and  oriented x4;  grossly normal neurologically.  Impression/Plan: Manuel Jones is here for an colonoscopy to be performed for a history of adenomatous polyps on 2020  Risks, benefits, limitations, and alternatives regarding  colonoscopy have been reviewed with the patient.  Questions have been answered.  All parties agreeable.   Rogelia Copping, MD  03/22/2024, 10:43 AM

## 2024-03-22 NOTE — Anesthesia Preprocedure Evaluation (Signed)
 Anesthesia Evaluation  Patient identified by MRN, date of birth, ID band Patient awake    Reviewed: Allergy & Precautions, NPO status , Patient's Chart, lab work & pertinent test results  Airway Mallampati: II  TM Distance: >3 FB Neck ROM: Full    Dental  (+) Upper Dentures   Pulmonary neg pulmonary ROS   Pulmonary exam normal        Cardiovascular Exercise Tolerance: Good negative cardio ROS Normal cardiovascular exam Rhythm:Regular Rate:Normal     Neuro/Psych negative neurological ROS  negative psych ROS   GI/Hepatic negative GI ROS, Neg liver ROS,GERD  Medicated,,  Endo/Other  negative endocrine ROS    Renal/GU negative Renal ROS  negative genitourinary   Musculoskeletal negative musculoskeletal ROS (+)    Abdominal   Peds negative pediatric ROS (+)  Hematology negative hematology ROS (+)   Anesthesia Other Findings Past Medical History: No date: GERD (gastroesophageal reflux disease) No date: Wears dentures     Comment:  full upper  Past Surgical History: No date: APPENDECTOMY 06/09/2018: COLONOSCOPY WITH PROPOFOL ; N/A     Comment:  Procedure: COLONOSCOPY WITH PROPOFOL ;  Surgeon: Unk Corinn Skiff, MD;  Location: [facility];                Service: Endoscopy;  Laterality: N/A; 06/09/2018: ESOPHAGOGASTRODUODENOSCOPY (EGD) WITH PROPOFOL ; N/A     Comment:  Procedure: ESOPHAGOGASTRODUODENOSCOPY (EGD) WITH               BIOPSIES;  Surgeon: Unk Corinn Skiff, MD;  Location:               [facility];  Service: Endoscopy;  Laterality:               N/A; 09/29/2018: ESOPHAGOGASTRODUODENOSCOPY (EGD) WITH PROPOFOL ; N/A     Comment:  Procedure: ESOPHAGOGASTRODUODENOSCOPY (EGD) WITH               PROPOFOL ;  Surgeon: Unk Corinn Skiff, MD;  Location:               [facility];  Service: Endoscopy;  Laterality:               N/A;  BMI    Body Mass Index: 33.36 kg/m       Reproductive/Obstetrics negative OB ROS                              Anesthesia Physical Anesthesia Plan  ASA: 2  Anesthesia Plan: General   Post-op Pain Management:    Induction: Intravenous  PONV Risk Score and Plan: Propofol  infusion and TIVA  Airway Management Planned: Natural Airway and Nasal Cannula  Additional Equipment:   Intra-op Plan:   Post-operative Plan:   Informed Consent: I have reviewed the patients History and Physical, chart, labs and discussed the procedure including the risks, benefits and alternatives for the proposed anesthesia with the patient or authorized representative who has indicated his/her understanding and acceptance.     Dental Advisory Given  Plan Discussed with: CRNA  Anesthesia Plan Comments:         Anesthesia Quick Evaluation

## 2024-03-22 NOTE — Anesthesia Postprocedure Evaluation (Signed)
 Anesthesia Post Note  Patient: Manuel Jones  Procedure(s) Performed: COLONOSCOPY POLYPECTOMY, INTESTINE  Patient location during evaluation: PACU Anesthesia Type: General Level of consciousness: awake and awake and alert Pain management: pain level controlled Vital Signs Assessment: post-procedure vital signs reviewed and stable Respiratory status: spontaneous breathing Cardiovascular status: stable Anesthetic complications: no   There were no known notable events for this encounter.   Last Vitals:  Vitals:   03/22/24 1200 03/22/24 1204  BP: (!) 129/94 (!) 118/91  Pulse: 65 65  Resp: 17 15  Temp:    SpO2: 97% 98%    Last Pain:  Vitals:   03/22/24 1200  TempSrc:   PainSc: 0-No pain                 VAN STAVEREN,Sulo Janczak

## 2024-03-22 NOTE — Op Note (Signed)
 [facility] Patient Name: Manuel Jones Procedure Date: 03/22/2024 11:10 AM MRN: [id number] Account #: [id number] Date of Birth: 1975/07/11 Admit Type: Outpatient Age: 48 Room: [facility] ENDO ROOM 4 Gender: Male Note Status: Finalized Instrument Name: Colon Scope [id number] Procedure:             Colonoscopy Indications:           Screening in patient at increased risk: Family history                         of 1st-degree relative with colorectal cancer Providers:             Rogelia Copping MD, MD Referring MD:          Velia Novak (Referring MD) Medicines:             Propofol  per Anesthesia Complications:         No immediate complications. Procedure:             Pre-Anesthesia Assessment:                        - Prior to the procedure, a History and Physical was                         performed, and patient medications and allergies were                         reviewed. The patient's tolerance of previous                         anesthesia was also reviewed. The risks and benefits                         of the procedure and the sedation options and risks                         were discussed with the patient. All questions were                         answered, and informed consent was obtained. Prior                         Anticoagulants: The patient has taken no anticoagulant                         or antiplatelet agents. ASA Grade Assessment: II - A                         patient with mild systemic disease. After reviewing                         the risks and benefits, the patient was deemed in                         satisfactory condition to undergo the procedure.                        After obtaining informed consent, the colonoscope was  passed under direct vision. Throughout the procedure,                         the patient's blood pressure, pulse, and oxygen                         saturations were monitored  continuously. The                         Colonoscope was introduced through the anus and                         advanced to the the cecum, identified by appendiceal                         orifice and ileocecal valve. The colonoscopy was                         performed without difficulty. The patient tolerated                         the procedure well. The quality of the bowel                         preparation was excellent. Findings:      The perianal and digital rectal examinations were normal.      Three sessile polyps were found in the sigmoid colon. The polyps were 3       to 6 mm in size. These polyps were removed with a cold snare. Resection       and retrieval were complete.      A 4 mm polyp was found in the descending colon. The polyp was sessile.       The polyp was removed with a cold snare. Resection and retrieval were       complete.      Multiple small-mouthed diverticula were found in the sigmoid colon and       descending colon.      Non-bleeding internal hemorrhoids were found during retroflexion. The       hemorrhoids were Grade I (internal hemorrhoids that do not prolapse). Impression:            - Three 3 to 6 mm polyps in the sigmoid colon, removed                         with a cold snare. Resected and retrieved.                        - One 4 mm polyp in the descending colon, removed with                         a cold snare. Resected and retrieved.                        - Diverticulosis in the sigmoid colon and in the                         descending colon.                        -  Non-bleeding internal hemorrhoids. Recommendation:        - Discharge patient to home.                        - Resume previous diet.                        - Continue present medications.                        - Await pathology results.                        - Repeat colonoscopy in 5 years for surveillance. Procedure Code(s):     --- Professional ---                         [phone], Colonoscopy, flexible; with removal of                         tumor(s), polyp(s), or other lesion(s) by snare                         technique Diagnosis Code(s):     --- Professional ---                        Z80.0, Family history of malignant neoplasm of                         digestive organs                        D12.5, Benign neoplasm of sigmoid colon CPT copyright 2022 [employer]. All rights reserved. The codes documented in this report are preliminary and upon coder review may  be revised to meet current compliance requirements. Rogelia Copping MD, MD 03/22/2024 11:37:56 AM This report has been signed electronically. Number of Addenda: 0 Note Initiated On: 03/22/2024 11:10 AM Scope Withdrawal Time: 0 hours 7 minutes 3 seconds  Total Procedure Duration: 0 hours 11 minutes 52 seconds  Estimated Blood Loss:  Estimated blood loss: none.      [facility]

## 2024-03-22 NOTE — Transfer of Care (Signed)
 Immediate Anesthesia Transfer of Care Note  Patient: Manuel Jones  Procedure(s) Performed: COLONOSCOPY POLYPECTOMY, INTESTINE  Patient Location: PACU  Anesthesia Type:General  Level of Consciousness: drowsy and patient cooperative  Airway & Oxygen Therapy: Patient Spontanous Breathing  Post-op Assessment: Report given to RN and Post -op Vital signs reviewed and stable  Post vital signs: stable  Last Vitals:  Vitals Value Taken Time  BP    Temp    Pulse    Resp    SpO2      Last Pain:  Vitals:   03/22/24 1018  TempSrc: Temporal         Complications: There were no known notable events for this encounter.

## 2024-03-23 ENCOUNTER — Ambulatory Visit: Payer: Self-pay | Admitting: Gastroenterology

## 2024-03-23 LAB — SURGICAL PATHOLOGY
